# Patient Record
Sex: Female | Born: 1940 | Race: Black or African American | Hispanic: No | Marital: Married | Smoking: Never smoker
Health system: Southern US, Community
[De-identification: ages and names within clinical notes are randomized; demographics above are authoritative.]

## PROBLEM LIST (undated history)

## (undated) DIAGNOSIS — I421 Obstructive hypertrophic cardiomyopathy: Secondary | ICD-10-CM

## (undated) DIAGNOSIS — E785 Hyperlipidemia, unspecified: Secondary | ICD-10-CM

## (undated) DIAGNOSIS — I10 Essential (primary) hypertension: Secondary | ICD-10-CM

## (undated) HISTORY — DX: Obstructive hypertrophic cardiomyopathy: I42.1

## (undated) HISTORY — DX: Essential (primary) hypertension: I10

## (undated) HISTORY — DX: Hyperlipidemia, unspecified: E78.5

---

## 1983-12-06 HISTORY — PX: ABDOMINAL HYSTERECTOMY: SHX81

## 2014-01-02 ENCOUNTER — Ambulatory Visit (INDEPENDENT_AMBULATORY_CARE_PROVIDER_SITE_OTHER): Payer: Medicare Other | Admitting: Family Medicine

## 2014-01-02 ENCOUNTER — Encounter: Payer: Self-pay | Admitting: Family Medicine

## 2014-01-02 ENCOUNTER — Other Ambulatory Visit: Payer: Self-pay | Admitting: *Deleted

## 2014-01-02 ENCOUNTER — Other Ambulatory Visit: Payer: Self-pay

## 2014-01-02 VITALS — BP 188/102 | HR 77 | Ht 66.0 in | Wt 192.0 lb

## 2014-01-02 DIAGNOSIS — Z0000 Encounter for general adult medical examination without abnormal findings: Secondary | ICD-10-CM

## 2014-01-02 DIAGNOSIS — I10 Essential (primary) hypertension: Secondary | ICD-10-CM

## 2014-01-02 DIAGNOSIS — Z1231 Encounter for screening mammogram for malignant neoplasm of breast: Secondary | ICD-10-CM

## 2014-01-02 DIAGNOSIS — Z01419 Encounter for gynecological examination (general) (routine) without abnormal findings: Secondary | ICD-10-CM | POA: Insufficient documentation

## 2014-01-02 LAB — TSH: TSH: 5.503 u[IU]/mL — ABNORMAL HIGH (ref 0.350–4.500)

## 2014-01-02 LAB — CBC WITH DIFFERENTIAL/PLATELET
Basophils Absolute: 0 10*3/uL (ref 0.0–0.1)
Basophils Relative: 1 % (ref 0–1)
EOS PCT: 1 % (ref 0–5)
Eosinophils Absolute: 0.1 10*3/uL (ref 0.0–0.7)
HEMATOCRIT: 38.7 % (ref 36.0–46.0)
Hemoglobin: 13.1 g/dL (ref 12.0–15.0)
LYMPHS ABS: 1.1 10*3/uL (ref 0.7–4.0)
Lymphocytes Relative: 18 % (ref 12–46)
MCH: 28.7 pg (ref 26.0–34.0)
MCHC: 33.9 g/dL (ref 30.0–36.0)
MCV: 84.9 fL (ref 78.0–100.0)
MONO ABS: 0.6 10*3/uL (ref 0.1–1.0)
Monocytes Relative: 10 % (ref 3–12)
Neutro Abs: 4.5 10*3/uL (ref 1.7–7.7)
Neutrophils Relative %: 70 % (ref 43–77)
PLATELETS: 257 10*3/uL (ref 150–400)
RBC: 4.56 MIL/uL (ref 3.87–5.11)
RDW: 13.6 % (ref 11.5–15.5)
WBC: 6.3 10*3/uL (ref 4.0–10.5)

## 2014-01-02 LAB — COMPREHENSIVE METABOLIC PANEL
ALT: 12 U/L (ref 0–35)
AST: 15 U/L (ref 0–37)
Albumin: 3.8 g/dL (ref 3.5–5.2)
Alkaline Phosphatase: 97 U/L (ref 39–117)
BUN: 14 mg/dL (ref 6–23)
CALCIUM: 9.1 mg/dL (ref 8.4–10.5)
CHLORIDE: 105 meq/L (ref 96–112)
CO2: 27 meq/L (ref 19–32)
CREATININE: 0.76 mg/dL (ref 0.50–1.10)
Glucose, Bld: 146 mg/dL — ABNORMAL HIGH (ref 70–99)
POTASSIUM: 3.9 meq/L (ref 3.5–5.3)
Sodium: 140 mEq/L (ref 135–145)
Total Bilirubin: 0.6 mg/dL (ref 0.2–1.2)
Total Protein: 7.2 g/dL (ref 6.0–8.3)

## 2014-01-02 LAB — LIPID PANEL
CHOLESTEROL: 181 mg/dL (ref 0–200)
HDL: 45 mg/dL (ref >39)
LDL Cholesterol: 119 mg/dL — ABNORMAL HIGH (ref 0–99)
TRIGLYCERIDES: 83 mg/dL (ref <150)
Total CHOL/HDL Ratio: 4 Ratio
VLDL: 17 mg/dL (ref 0–40)

## 2014-01-02 LAB — POCT GLYCOSYLATED HEMOGLOBIN (HGB A1C): HEMOGLOBIN A1C: 7

## 2014-01-02 MED ORDER — HYDROCHLOROTHIAZIDE 25 MG PO TABS: 25.0000 mg | ORAL_TABLET | Freq: Every day | ORAL | Status: DC

## 2014-01-02 NOTE — Progress Notes (Signed)
Patient ID: Wendy Velez, female   DOB: May 15, 1941, 73 y.o.   MRN: [id number]   Kevin FentonSamuel Bradshaw, MD Phone: [phone]  Subjective:  Chief complaint-noted  Patient here to establish care. She states that greater than 10 years ago she had hypertension and was on medications for it at that time. She's had an EGD and a colonoscopy previously and was told that she had some changes of reflux at that time. She denies heartburn today.  Hypertension  - blood pressure is 188/109 initially and on recheck. She denies headache, chest pain, dyspnea, palpitations, and edema.  She notes some polyuria. Described as approximately 2 times of urination at night for several months. She denies dysuria and foul-smelling urine. Her son was recently diagnosed with diabetes.  She has a strong family history of Alzheimer's with her mother, and paternal uncle and aunt. She's a never smoker, and drinks about one glass of wine daily.  I have reviewed and updated her medical, surgical, family, and social history in the relevant portions of EMR.  ROS- Positive night sweats No dyspnea No chest pain No Abdominal pain   Past Medical History Patient Active Problem List   Diagnosis Date Noted  . Essential hypertension, benign 01/02/2014  . Well woman exam 01/02/2014    Medications- reviewed and updated Current Outpatient Prescriptions  Medication Sig Dispense Refill  . hydrochlorothiazide (HYDRODIURIL) 25 MG tablet Take 1 tablet (25 mg total) by mouth daily.  3 tablet  1   No current facility-administered medications for this visit.    Objective: BP 188/102  Pulse 77  Ht 5\' 6"  (1.676 m)  Wt 192 lb (87.091 kg)  BMI 31.00 kg/m2 Gen: NAD, alert, cooperative with exam HEENT: NCAT CV: RRR, good S1/S2, no murmur Resp: CTABL, no wheezes, non-labored Abd: SNTND, BS present, no guarding or organomegaly Ext: No edema, warm Neuro: Alert and oriented, No gross deficits   Assessment/Plan:  Essential  hypertension, benign Normally I prefer to have elevated readings at 2 different occasions, however considering her BP of 188/109 and history of hypertension I will go ahead and treat her. .Hydrochlorothiazide 25 daily Check fasting lipid, BMP, CBC, TSH, A1c Followup 3-4 weeks for blood pressure  Well woman exam Discussed need for colonoscopy and mammogram. Has had EGD and colonoscopy in the past with only changes of GERD at that time. Recommended annual pelvic exam, schedule it later date. Has had hysterectomy in the 80s, still has her ovaries.    Orders Placed This Encounter  Procedures  . Lipid Panel  . TSH  . CBC with Differential  . Comprehensive metabolic panel  . HgB A1c    Meds ordered this encounter  Medications  . hydrochlorothiazide (HYDRODIURIL) 25 MG tablet    Sig: Take 1 tablet (25 mg total) by mouth daily.    Dispense:  3 tablet    Refill:  1

## 2014-01-02 NOTE — Assessment & Plan Note (Addendum)
Normally I prefer to have elevated readings at 2 different occasions, however considering her BP of 188/109 and history of hypertension I will go ahead and treat her. .Hydrochlorothiazide 25 daily Check fasting lipid, BMP, CBC, TSH, A1c Followup 3-4 weeks for blood pressure

## 2014-01-02 NOTE — Patient Instructions (Addendum)
Great to meet you!  Lets bring you back in 1 month to check your blood pressure.   Call the GI doctors to get a colonoscopy  Be sure to get a mammogram

## 2014-01-02 NOTE — Assessment & Plan Note (Signed)
Discussed need for colonoscopy and mammogram. Has had EGD and colonoscopy in the past with only changes of GERD at that time. Recommended annual pelvic exam, schedule it later date. Has had hysterectomy in the 80s, still has her ovaries.

## 2014-01-06 ENCOUNTER — Telehealth: Payer: Self-pay | Admitting: Family Medicine

## 2014-01-06 NOTE — Telephone Encounter (Signed)
Called to follow up on labs  She has A1C 7.0 which means she ahs diabetes, at this point would be considered diet controlled but I'd like to offer metformin  Also TSH is high (5) so she may need synthroid but I think we should review symptoms and if she wants to be on this before I prescribe it.   Will ask red team to offer appt.   Left VM.   Murtis SinkSam Ronald Vinsant, MD [facility] Resident, PGY-2 01/06/2014, 12:34 PM

## 2014-01-06 NOTE — Telephone Encounter (Signed)
Called pt and informed. .Wendy Velez  

## 2014-01-15 ENCOUNTER — Ambulatory Visit
Admission: RE | Admit: 2014-01-15 | Discharge: 2014-01-15 | Disposition: A | Discharge status: Home / Self Care | Payer: Medicare Other | Source: Ambulatory Visit

## 2014-01-15 DIAGNOSIS — Z1231 Encounter for screening mammogram for malignant neoplasm of breast: Secondary | ICD-10-CM

## 2014-02-06 ENCOUNTER — Ambulatory Visit (INDEPENDENT_AMBULATORY_CARE_PROVIDER_SITE_OTHER): Payer: Medicare Other | Admitting: Family Medicine

## 2014-02-06 ENCOUNTER — Encounter: Payer: Self-pay | Admitting: Gastroenterology

## 2014-02-06 ENCOUNTER — Encounter: Payer: Self-pay | Admitting: Family Medicine

## 2014-02-06 VITALS — BP 178/92 | HR 80 | Ht 66.0 in | Wt 194.0 lb

## 2014-02-06 DIAGNOSIS — E119 Type 2 diabetes mellitus without complications: Secondary | ICD-10-CM | POA: Insufficient documentation

## 2014-02-06 DIAGNOSIS — F22 Delusional disorders: Secondary | ICD-10-CM | POA: Insufficient documentation

## 2014-02-06 DIAGNOSIS — I10 Essential (primary) hypertension: Secondary | ICD-10-CM

## 2014-02-06 DIAGNOSIS — E785 Hyperlipidemia, unspecified: Secondary | ICD-10-CM | POA: Insufficient documentation

## 2014-02-06 MED ORDER — PRAVASTATIN SODIUM 40 MG PO TABS: 40.0000 mg | ORAL_TABLET | Freq: Every day | ORAL | Status: DC

## 2014-02-06 MED ORDER — HYDROCHLOROTHIAZIDE 25 MG PO TABS: 25.0000 mg | ORAL_TABLET | Freq: Every day | ORAL | Status: DC

## 2014-02-06 MED ORDER — OLMESARTAN MEDOXOMIL 20 MG PO TABS: 20.0000 mg | ORAL_TABLET | Freq: Every day | ORAL | Status: DC

## 2014-02-06 NOTE — Assessment & Plan Note (Signed)
ASCVD score of 10.2, start moderate intensity statin-pravastatin 20 mg today. Recheck lipids in one year

## 2014-02-06 NOTE — Assessment & Plan Note (Signed)
Elevated today as high as 210/94, decreased to 178/92 on recheck No red flags  good med compliance, continue HCTZ add olmesartan 20 mg daily If she develops dyspnea will consider echo with soft crackles in right lower lung and likely chronic uncontrolled hypertension

## 2014-02-06 NOTE — Assessment & Plan Note (Signed)
Patient story about the people watching her seems unrealistic as far as I can see Does not appear to be any danger to herself or any other people, also not interfering with her life at all Will continue to monitor, no treatment needed at this time

## 2014-02-06 NOTE — Assessment & Plan Note (Signed)
Diet controlled with A1c of 7.0, new diagnosis Discussed lifestyle modification including cutting down on carbs and increasing exercise Offered metformin and last modifications, patient would like to modify lifestyle at this time  Followup A1c in 2 months

## 2014-02-06 NOTE — Patient Instructions (Signed)
It was great to see you today!  Your blood pressure is a big deal, I started another medicine for it, Olmesartan (benicar)  I also started a cholesterol medicine called pravastatin  Diet Recommendations for Diabetes   Starchy (carb) foods include: Bread, rice, pasta, potatoes, corn, crackers, bagels, muffins, all baked goods.   Protein foods include: Meat, fish, poultry, eggs, dairy foods, and beans such as pinto and kidney beans (beans also provide carbohydrate).   1. Eat at least 3 meals and 1-2 snacks per day. Never go more than 4-5 hours while awake without eating.  2. Limit starchy foods to TWO per meal and ONE per snack. ONE portion of a starchy  food is equal to the following:   - ONE slice of bread (or its equivalent, such as half of a hamburger bun).   - 1/2 cup of a "scoopable" starchy food such as potatoes or rice.   - 1 OUNCE (28 grams) of starchy snack foods such as crackers or pretzels (look on label).   - 15 grams of carbohydrate as shown on food label.  3. Both lunch and dinner should include a protein food, a carb food, and vegetables.   - Obtain twice as many veg's as protein or carbohydrate foods for both lunch and dinner.   - Try to keep frozen veg's on hand for a quick vegetable serving.     - Fresh or frozen veg's are best.  4. Breakfast should always include protein.     Hypertension As your heart beats, it forces blood through your arteries. This force is your blood pressure. If the pressure is too high, it is called hypertension (HTN) or high blood pressure. HTN is dangerous because you may have it and not know it. High blood pressure may mean that your heart has to work harder to pump blood. Your arteries may be narrow or stiff. The extra work puts you at risk for heart disease, stroke, and other problems.  Blood pressure consists of two numbers, a higher number over a lower, 110/72, for example. It is stated as "110 over 72." The ideal is below 120 for the top  number (systolic) and under 80 for the bottom (diastolic). Write down your blood pressure today. You should pay close attention to your blood pressure if you have certain conditions such as:  Heart failure.  Prior heart attack.  Diabetes  Chronic kidney disease.  Prior stroke.  Multiple risk factors for heart disease. To see if you have HTN, your blood pressure should be measured while you are seated with your arm held at the level of the heart. It should be measured at least twice. A one-time elevated blood pressure reading (especially in the Emergency Department) does not mean that you need treatment. There may be conditions in which the blood pressure is different between your right and left arms. It is important to see your caregiver soon for a recheck. Most people have essential hypertension which means that there is not a specific cause. This type of high blood pressure may be lowered by changing lifestyle factors such as:  Stress.  Smoking.  Lack of exercise.  Excessive weight.  Drug/tobacco/alcohol use.  Eating less salt. Most people do not have symptoms from high blood pressure until it has caused damage to the body. Effective treatment can often prevent, delay or reduce that damage. TREATMENT  When a cause has been identified, treatment for high blood pressure is directed at the cause. There are a  large number of medications to treat HTN. These fall into several categories, and your caregiver will help you select the medicines that are best for you. Medications may have side effects. You should review side effects with your caregiver. If your blood pressure stays high after you have made lifestyle changes or started on medicines,   Your medication(s) may need to be changed.  Other problems may need to be addressed.  Be certain you understand your prescriptions, and know how and when to take your medicine.  Be sure to follow up with your caregiver within the time frame  advised (usually within two weeks) to have your blood pressure rechecked and to review your medications.  If you are taking more than one medicine to lower your blood pressure, make sure you know how and at what times they should be taken. Taking two medicines at the same time can result in blood pressure that is too low. SEEK IMMEDIATE MEDICAL CARE IF:  You develop a severe headache, blurred or changing vision, or confusion.  You have unusual weakness or numbness, or a faint feeling.  You have severe chest or abdominal pain, vomiting, or breathing problems. MAKE SURE YOU:   Understand these instructions.  Will watch your condition.  Will get help right away if you are not doing well or get worse. Document Released: 11/21/2005 Document Revised: 02/13/2012 Document Reviewed: 07/11/2008 [facility] Patient Information 2014 [place], [place].

## 2014-02-06 NOTE — Progress Notes (Signed)
Patient ID: Wendy Velez, female   DOB: 1941/01/06, 73 y.o.   MRN: [id number]  Kevin FentonSamuel Bradshaw, MD Phone: [phone]  Subjective:  Chief complaint-noted  # followup of hypertension, diabetes  Hypertension Taking meds regularly, no headache, chest pain, dyspnea, palpitations, or lower extremity edema Does not check her blood pressure at home Wants to start walking and plans to tomorrow.  Diabetes Previously had not seen a Dr. in over 10 years when she established with me in January. She had an A1c of 7.0 that time. Today we discussed starting medications versus lifestyle modification and she would like to begin watching her diet and start exercising.  Delusions The patient describes to me that since 1998 people been watching her and "out to get her" using her phone and bank account to monitor her. She states that every time she passes her phone at home it flashes at her so she knows that they can see her. These do not seem to be interfering with her life, she can use the telephone she is very careful when she speaks of it., she states that her kids have also been watched, and they use telephones, she states that her daughter has gotten a divorce because of the people watching her.   ROS-  A fever, chills, sweats Per history of present illness  Past Medical History Patient Active Problem List   Diagnosis Date Noted  . DM2 (diabetes mellitus, type 2) 02/06/2014  . HLD (hyperlipidemia) 02/06/2014  . 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 Essential hypertension, benign 01/02/2014  . Well woman exam 01/02/2014    Medications- reviewed and updated Current Outpatient Prescriptions  Medication Sig Dispense Refill  . hydrochlorothiazide (HYDRODIURIL) 25 MG tablet Take 1 tablet (25 mg total) by mouth daily.  31 tablet  1  . Nutritional Supplements (ESTROVEN PO) Take by mouth. Has black cohosh      . olmesartan (BENICAR) 20 MG tablet Take 1 tablet (20 mg total) by mouth daily.  30 tablet   5  . pravastatin (PRAVACHOL) 40 MG tablet Take 1 tablet (40 mg total) by mouth daily.  90 tablet  3   No current facility-administered medications for this visit.    Objective: BP 178/92  Pulse 80  Ht 5\' 6"  (1.676 m)  Wt 194 lb (87.998 kg)  BMI 31.33 kg/m2 Gen: NAD, alert, cooperative with exam HEENT: NCAT CV: RRR, good S1/S2, no murmur Resp: Mostly clear bilaterally, some soft crackles on right lower lung field, good air movement, nonlabored Ext: No edema, warm Neuro: Alert and oriented, No gross deficits   Assessment/Plan:  DM2 (diabetes mellitus, type 2) Diet controlled with A1c of 7.0, new diagnosis Discussed lifestyle modification including cutting down on carbs and increasing exercise Offered metformin and last modifications, patient would like to modify lifestyle at this time  Followup A1c in 2 months  Essential hypertension, benign Elevated today as high as 210/94, decreased to 178/92 on recheck No red flags  good med compliance, continue HCTZ add olmesartan 20 mg daily If she develops dyspnea will consider echo with soft crackles in right lower lung and likely chronic uncontrolled hypertension  HLD (hyperlipidemia) ASCVD score of 10.2, start moderate intensity statin-pravastatin 20 mg today. Recheck lipids in one year   Paranoid delusion Patient story about the people watching her seems unrealistic as far as I can see Does not appear to be any danger to herself or any other people, also not interfering with her life at all Will continue to monitor, no  treatment needed at this time    No orders of the defined types were placed in this encounter.    Meds ordered this encounter  Medications  . olmesartan (BENICAR) 20 MG tablet    Sig: Take 1 tablet (20 mg total) by mouth daily.    Dispense:  30 tablet    Refill:  5  . hydrochlorothiazide (HYDRODIURIL) 25 MG tablet    Sig: Take 1 tablet (25 mg total) by mouth daily.    Dispense:  31 tablet    Refill:  1    . pravastatin (PRAVACHOL) 40 MG tablet    Sig: Take 1 tablet (40 mg total) by mouth daily.    Dispense:  90 tablet    Refill:  3

## 2014-02-26 ENCOUNTER — Ambulatory Visit (INDEPENDENT_AMBULATORY_CARE_PROVIDER_SITE_OTHER): Payer: Medicare Other | Admitting: Family Medicine

## 2014-02-26 ENCOUNTER — Encounter: Payer: Self-pay | Admitting: Family Medicine

## 2014-02-26 VITALS — BP 168/96 | HR 61 | Temp 97.5°F | Ht 66.0 in | Wt 192.0 lb

## 2014-02-26 DIAGNOSIS — E785 Hyperlipidemia, unspecified: Secondary | ICD-10-CM

## 2014-02-26 DIAGNOSIS — I10 Essential (primary) hypertension: Secondary | ICD-10-CM

## 2014-02-26 MED ORDER — HYDROCHLOROTHIAZIDE 25 MG PO TABS: 25.0000 mg | ORAL_TABLET | Freq: Every day | ORAL | Status: DC

## 2014-02-26 MED ORDER — AMLODIPINE BESYLATE 10 MG PO TABS: 10.0000 mg | ORAL_TABLET | Freq: Every day | ORAL | Status: DC

## 2014-02-26 NOTE — Assessment & Plan Note (Signed)
Elevated today to 168/96, this is an improvement from when she first presented here. Pulse 61 Complaining of headaches and feels that it's associated with olmesartan, this is not a typical side effect from a but may be secondary to decreasing her blood pressure. (Along with her intermittent dizziness) I discussed this with her I did agree to change her blood pressure medicine. Amlodipine, continue HCTZ, continue pravastatin If headaches do not improve would consider changing statin I expect that she bleeds 3 blood pressure medications for control but doesn't seem to be tolerating rapid changes, subtle plan on adding another agent next month for a followup for diabetes. Has made good lifestyle modifications

## 2014-02-26 NOTE — Assessment & Plan Note (Signed)
Good lifestyle modifications, taking statin Continue pravastatin

## 2014-02-26 NOTE — Patient Instructions (Signed)
It was great to see you today!  We're getting closer on your blood pressure, PLease come back in 1 month for a f/u of blood pressure and diabetes.   Diet Recommendations for Diabetes   Starchy (carb) foods include: Bread, rice, pasta, potatoes, corn, crackers, bagels, muffins, all baked goods.   Protein foods include: Meat, fish, poultry, eggs, dairy foods, and beans such as pinto and kidney beans (beans also provide carbohydrate).   1. Eat at least 3 meals and 1-2 snacks per day. Never go more than 4-5 hours while awake without eating.  2. Limit starchy foods to TWO per meal and ONE per snack. ONE portion of a starchy  food is equal to the following:   - ONE slice of bread (or its equivalent, such as half of a hamburger bun).   - 1/2 cup of a "scoopable" starchy food such as potatoes or rice.   - 1 OUNCE (28 grams) of starchy snack foods such as crackers or pretzels (look on label).   - 15 grams of carbohydrate as shown on food label.  3. Both lunch and dinner should include a protein food, a carb food, and vegetables.   - Obtain twice as many veg's as protein or carbohydrate foods for both lunch and dinner.   - Try to keep frozen veg's on hand for a quick vegetable serving.     - Fresh or frozen veg's are best.  4. Breakfast should always include protein.

## 2014-02-26 NOTE — Progress Notes (Addendum)
Patient ID: Wendy Velez, female   DOB: [birth year], 73 y.o.   MRN: [id number]  Kevin FentonSamuel Bradshaw, MD Phone: [phone]  Subjective:  Chief complaint-noted  # Followup for hypertension  Patient states that he's been having headaches since she started olmesartan. She describes them as once or twice daily frontal headaches lasting one to 2 hours. Moderate in severity. No associated nausea, vomiting, photosensitivity. She also has some slight dizziness for the last month, which she describes as intermittent, mild, and only every few days.  Has not been checking her blood pressure on home. She is going to [facility] to check it but can't figure out how to use the machine.  She's walking 5 times a week about 20-30 minutes now.  No chest pain, dyspnea, palpitations, or leg edema.   ROS- Per HPI  Past Medical History Patient Active Problem List   Diagnosis Date Noted  . DM2 (diabetes mellitus, type 2) 02/06/2014  . HLD (hyperlipidemia) 02/06/2014  . 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 Essential hypertension, benign 01/02/2014  . Well woman exam 01/02/2014    Medications- reviewed and updated Current Outpatient Prescriptions  Medication Sig Dispense Refill  . hydrochlorothiazide (HYDRODIURIL) 25 MG tablet Take 1 tablet (25 mg total) by mouth daily.  31 tablet  11  . Nutritional Supplements (ESTROVEN PO) Take by mouth. Has black cohosh      . pravastatin (PRAVACHOL) 40 MG tablet Take 1 tablet (40 mg total) by mouth daily.  90 tablet  3  . amLODipine (NORVASC) 10 MG tablet Take 1 tablet (10 mg total) by mouth daily.  30 tablet  5   No current facility-administered medications for this visit.    Objective: BP 168/96  Pulse 61  Temp(Src) 97.5 F (36.4 C) (Oral)  Ht 5\' 6"  (1.676 m)  Wt 192 lb (87.091 kg)  BMI 31.00 kg/m2 Gen: NAD, alert, cooperative with exam HEENT: NCAT CV: RRR, good S1/S2, no murmur Resp: CTABL, no wheezes, non-labored Ext: No edema, warm Neuro: Alert and  oriented, No gross deficits   Assessment/Plan:  Essential hypertension, benign Elevated today to 168/96, this is an improvement from when she first presented here. Pulse 61 Complaining of headaches and feels that it's associated with olmesartan, this is not a typical side effect from a but may be secondary to decreasing her blood pressure. (Along with her intermittent dizziness) I discussed this with her I did agree to change her blood pressure medicine. Amlodipine, continue HCTZ, continue pravastatin If headaches do not improve would consider changing statin I expect that she bleeds 3 blood pressure medications for control but doesn't seem to be tolerating rapid changes, subtle plan on adding another agent next month for a followup for diabetes. Has made good lifestyle modifications  HLD (hyperlipidemia) Good lifestyle modifications, taking statin Continue pravastatin    No orders of the defined types were placed in this encounter.    Meds ordered this encounter  Medications  . amLODipine (NORVASC) 10 MG tablet    Sig: Take 1 tablet (10 mg total) by mouth daily.    Dispense:  30 tablet    Refill:  5  . hydrochlorothiazide (HYDRODIURIL) 25 MG tablet    Sig: Take 1 tablet (25 mg total) by mouth daily.    Dispense:  31 tablet    Refill:  11

## 2014-03-13 ENCOUNTER — Ambulatory Visit (AMBULATORY_SURGERY_CENTER): Payer: Self-pay | Admitting: *Deleted

## 2014-03-13 VITALS — Ht 67.5 in | Wt 193.2 lb

## 2014-03-13 DIAGNOSIS — Z1211 Encounter for screening for malignant neoplasm of colon: Secondary | ICD-10-CM

## 2014-03-13 MED ORDER — MOVIPREP 100 G PO SOLR
ORAL | Status: DC
Start: 2014-03-13 — End: 2014-03-25

## 2014-03-13 NOTE — Progress Notes (Signed)
No allergies to eggs or soy. No problems with anesthesia.  

## 2014-03-25 ENCOUNTER — Encounter: Payer: Self-pay | Admitting: Gastroenterology

## 2014-03-25 ENCOUNTER — Ambulatory Visit (AMBULATORY_SURGERY_CENTER): Payer: Medicare Other | Admitting: Gastroenterology

## 2014-03-25 VITALS — BP 136/93 | HR 97 | Temp 98.0°F | Resp 21 | Ht 67.0 in | Wt 193.0 lb

## 2014-03-25 DIAGNOSIS — K5730 Diverticulosis of large intestine without perforation or abscess without bleeding: Secondary | ICD-10-CM

## 2014-03-25 DIAGNOSIS — K644 Residual hemorrhoidal skin tags: Secondary | ICD-10-CM

## 2014-03-25 DIAGNOSIS — Z1211 Encounter for screening for malignant neoplasm of colon: Secondary | ICD-10-CM

## 2014-03-25 MED ORDER — SODIUM CHLORIDE 0.9 % IV SOLN: 500.0000 mL | INTRAVENOUS | Status: DC

## 2014-03-25 NOTE — Op Note (Signed)
[facility] [address] [place], [id number]   COLONOSCOPY PROCEDURE REPORT  PATIENT: [name], Wendy J.  MR#: [id number] BIRTHDATE: [birth year] , 72  yrs. old GENDER: Female ENDOSCOPIST: Rachael Feeaniel P Breahna Boylen, MD REFERRED BY: Kevin FentonSamuel Bradshaw, MD PROCEDURE DATE:  03/25/2014 PROCEDURE:   Colonoscopy, screening First Screening Colonoscopy - Avg.  risk and is 50 yrs.  old or older - No.  Prior Negative Screening - Now for repeat screening. 10 or more years since last screening  History of Adenoma - Now for follow-up colonoscopy & has been > or = to 3 yrs.  N/A  Polyps Removed Today? No.  Recommend repeat exam, <10 yrs? No. ASA CLASS:   Class II INDICATIONS:average risk screening. MEDICATIONS: MAC sedation, administered by CRNA and propofol (Diprivan) 200mg  IV  DESCRIPTION OF PROCEDURE:   After the risks benefits and alternatives of the procedure were thoroughly explained, informed consent was obtained.  A digital rectal exam revealed no abnormalities of the rectum.   The LB [id number]  endoscope was introduced through the anus and advanced to the cecum, which was identified by both the appendix and ileocecal valve. No adverse events experienced.   The quality of the prep was excellent.  The instrument was then slowly withdrawn as the colon was fully examined.  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  There were small to medium external hemorrhoids.  The examination was otherwise normal.  Retroflexed views revealed no abnormalities. The time to cecum=3 minutes 33 seconds.  Withdrawal time=7 minutes 49 seconds.  The scope was withdrawn and the procedure completed. COMPLICATIONS: There were no complications.  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 There were small to medium external hemorrhoids. The examination was otherwise normal.  No polyps or cancers  RECOMMENDATIONS: Given your age, you will not need another  colonoscopy for colon cancer screening or polyp surveillance.  These types of tests usually stop around the age 75.   eSigned:  Rachael Feeaniel P Marigene Erler, MD 03/25/2014 11:06 AM

## 2014-03-25 NOTE — Progress Notes (Signed)
Report to pacu rn, vss, bbs=clear 

## 2014-03-25 NOTE — Patient Instructions (Signed)

## 2014-03-26 ENCOUNTER — Telehealth: Payer: Self-pay | Admitting: *Deleted

## 2014-03-26 NOTE — Telephone Encounter (Signed)
  Follow up Call-  Call back number 03/25/2014  Post procedure Call Back phone  # [phone]  Permission to leave phone message Yes     Patient questions:  Do you have a fever, pain , or abdominal swelling? no Pain Score  0 *  Have you tolerated food without any problems? yes  Have you been able to return to your normal activities? yes  Do you have any questions about your discharge instructions: Diet   no Medications  no Follow up visit  no  Do you have questions or concerns about your Care? no  Actions: * If pain score is 4 or above: No action needed, pain <4.

## 2014-04-02 ENCOUNTER — Encounter: Payer: Self-pay | Admitting: Family Medicine

## 2014-04-02 ENCOUNTER — Ambulatory Visit (HOSPITAL_COMMUNITY)
Admission: RE | Admit: 2014-04-02 | Discharge: 2014-04-02 | Disposition: A | Discharge status: Home / Self Care | Payer: Medicare Other | Source: Ambulatory Visit | Attending: Family Medicine | Admitting: Family Medicine

## 2014-04-02 ENCOUNTER — Ambulatory Visit (INDEPENDENT_AMBULATORY_CARE_PROVIDER_SITE_OTHER): Payer: Medicare Other | Admitting: Family Medicine

## 2014-04-02 VITALS — BP 145/94 | HR 80 | Temp 98.0°F | Wt 191.0 lb

## 2014-04-02 DIAGNOSIS — I10 Essential (primary) hypertension: Secondary | ICD-10-CM

## 2014-04-02 DIAGNOSIS — R9431 Abnormal electrocardiogram [ECG] [EKG]: Secondary | ICD-10-CM | POA: Insufficient documentation

## 2014-04-02 DIAGNOSIS — N644 Mastodynia: Secondary | ICD-10-CM

## 2014-04-02 LAB — CBC WITH DIFFERENTIAL/PLATELET
BASOS ABS: 0 10*3/uL (ref 0.0–0.1)
BASOS PCT: 0 % (ref 0–1)
EOS PCT: 4 % (ref 0–5)
Eosinophils Absolute: 0.3 10*3/uL (ref 0.0–0.7)
HCT: 36.1 % (ref 36.0–46.0)
HEMOGLOBIN: 12.2 g/dL (ref 12.0–15.0)
LYMPHS PCT: 20 % (ref 12–46)
Lymphs Abs: 1.4 10*3/uL (ref 0.7–4.0)
MCH: 28.7 pg (ref 26.0–34.0)
MCHC: 33.8 g/dL (ref 30.0–36.0)
MCV: 84.9 fL (ref 78.0–100.0)
MONO ABS: 0.6 10*3/uL (ref 0.1–1.0)
Monocytes Relative: 9 % (ref 3–12)
Neutro Abs: 4.6 10*3/uL (ref 1.7–7.7)
Neutrophils Relative %: 67 % (ref 43–77)
Platelets: 264 10*3/uL (ref 150–400)
RBC: 4.25 MIL/uL (ref 3.87–5.11)
RDW: 13.4 % (ref 11.5–15.5)
WBC: 6.8 10*3/uL (ref 4.0–10.5)

## 2014-04-02 NOTE — Patient Instructions (Signed)
At to see you today!  You will be called in the next week or so for your appointments.   Your pain could be from your heart, please dont hesitate to seek medical help if it worsens  Chest Pain (Nonspecific) It is often hard to give a specific diagnosis for the cause of chest pain. There is always a chance that your pain could be related to something serious, such as a heart attack or a blood clot in the lungs. You need to follow up with your caregiver for further evaluation. CAUSES   Heartburn.  Pneumonia or bronchitis.  Anxiety or stress.  Inflammation around your heart (pericarditis) or lung (pleuritis or pleurisy).  A blood clot in the lung.  A collapsed lung (pneumothorax). It can develop suddenly on its own (spontaneous pneumothorax) or from injury (trauma) to the chest.  Shingles infection (herpes zoster virus). The chest wall is composed of bones, muscles, and cartilage. Any of these can be the source of the pain.  The bones can be bruised by injury.  The muscles or cartilage can be strained by coughing or overwork.  The cartilage can be affected by inflammation and become sore (costochondritis). DIAGNOSIS  Lab tests or other studies, such as X-rays, electrocardiography, stress testing, or cardiac imaging, may be needed to find the cause of your pain.  TREATMENT   Treatment depends on what may be causing your chest pain. Treatment may include:  Acid blockers for heartburn.  Anti-inflammatory medicine.  Pain medicine for inflammatory conditions.  Antibiotics if an infection is present.  You may be advised to change lifestyle habits. This includes stopping smoking and avoiding alcohol, caffeine, and chocolate.  You may be advised to keep your head raised (elevated) when sleeping. This reduces the chance of acid going backward from your stomach into your esophagus.  Most of the time, nonspecific chest pain will improve within 2 to 3 days with rest and mild pain  medicine. HOME CARE INSTRUCTIONS   If antibiotics were prescribed, take your antibiotics as directed. Finish them even if you start to feel better.  For the next few days, avoid physical activities that bring on chest pain. Continue physical activities as directed.  Do not smoke.  Avoid drinking alcohol.  Only take over-the-counter or prescription medicine for pain, discomfort, or fever as directed by your caregiver.  Follow your caregiver's suggestions for further testing if your chest pain does not go away.  Keep any follow-up appointments you made. If you do not go to an appointment, you could develop lasting (chronic) problems with pain. If there is any problem keeping an appointment, you must call to reschedule. SEEK MEDICAL CARE IF:   You think you are having problems from the medicine you are taking. Read your medicine instructions carefully.  Your chest pain does not go away, even after treatment.  You develop a rash with blisters on your chest. SEEK IMMEDIATE MEDICAL CARE IF:   You have increased chest pain or pain that spreads to your arm, neck, jaw, back, or abdomen.  You develop shortness of breath, an increasing cough, or you are coughing up blood.  You have severe back or abdominal pain, feel nauseous, or vomit.  You develop severe weakness, fainting, or chills.  You have a fever. THIS IS AN EMERGENCY. Do not wait to see if the pain will go away. Get medical help at once. Call your local emergency services (911 in [place] Do not drive yourself to the hospital. MAKE SURE YOU:  Understand these instructions.  Will watch your condition.  Will get help right away if you are not doing well or get worse. Document Released: 08/31/2005 Document Revised: 02/13/2012 Document Reviewed: 06/26/2008 [facility] Patient Information 2014 [place].

## 2014-04-02 NOTE — Assessment & Plan Note (Signed)
Patient very concerned despite negative mammo, could ceartainly be referred pain from CAD, not exertional though Exam unrevealing, will send to GYN for 2nd opinion  Have not discussed with her that I think she has paranoid delusions, hopefully this is not the beginning of a delusion about a medical problem Considering cough, dysphagia, rsik factors, and radiation to R arm EKG performed with arrythmia but no specific findings, will ask cards to eval as well.

## 2014-04-02 NOTE — Assessment & Plan Note (Signed)
With breast pain, Q wave in lead 3 and abnormal complex in lead 2 With her risk factors  Of HTN, DM2 I think a cards eval is reasonable Checked CBC and troponin, pain for several weeks

## 2014-04-02 NOTE — Progress Notes (Signed)
Patient ID: Wendy Velez, female   DOB: 17-Feb-1941, 73 y.o.   MRN: [id number]  Kevin FentonSamuel Bradshaw, MD Phone: [phone]  Subjective:  Chief complaint-noted  # R breast pain, HTN, cough  Pt with previous paranoid delusions, DM2 and HTN here with R breast discomfort for several weeks, not convinced by her previous negative mammo. State she has an indention in her R breast and doesn't feel this is normal.  Denies dc Discomfort radiates to R arm No exertional component, no palpitations, no dyspnea Also having 2 weeks of cough and mild intermittent dysphagia that she feels is connected.   HTN No overt CP but bnreast pain as above, No palpitations, and no edema Taking meds regularly  ROS- Per HPI  Past Medical History Patient Active Problem List   Diagnosis Date Noted  . Breast pain in female 04/02/2014  . Nonspecific abnormal electrocardiogram (ECG) (EKG) 04/02/2014  . DM2 (diabetes mellitus, type 2) 02/06/2014  . HLD (hyperlipidemia) 02/06/2014  . 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 Essential hypertension, benign 01/02/2014  . Well woman exam 01/02/2014    Medications- reviewed and updated Current Outpatient Prescriptions  Medication Sig Dispense Refill  . amLODipine (NORVASC) 10 MG tablet Take 1 tablet (10 mg total) by mouth daily.  30 tablet  5  . hydrochlorothiazide (HYDRODIURIL) 25 MG tablet Take 1 tablet (25 mg total) by mouth daily.  31 tablet  11  . Nutritional Supplements (ESTROVEN PO) Take by mouth. Has black cohosh      . pravastatin (PRAVACHOL) 40 MG tablet Take 1 tablet (40 mg total) by mouth daily.  90 tablet  3   No current facility-administered medications for this visit.    Objective: BP 145/94  Pulse 80  Temp(Src) 98 F (36.7 C) (Oral)  Wt 191 lb (86.637 kg) Gen: NAD, alert, cooperative with exam HEENT: NCAT CV: irregular irregular, no murmur, normal rate Breast: no fixed masses or rashes, no dc, nippples WNL Resp: CTABL, no wheezes,  non-labored Ext: No edema, warm Neuro: Alert and oriented, No gross deficits   Assessment/Plan:  Nonspecific abnormal electrocardiogram (ECG) (EKG) With breast pain, Q wave in lead 3 and abnormal complex in lead 2 With her risk factors  Of HTN, DM2 I think a cards eval is reasonable Checked CBC and troponin, pain for several weeks   Essential hypertension, benign Controlled better today, good compliance ARB caused HA Continue amlodipine and HCTZ, if CAD reveals itself a BB will be needed, plan coreg  Breast pain in female Patient very concerned despite negative mammo, could ceartainly be referred pain from CAD, not exertional though Exam unrevealing, will send to GYN for 2nd opinion  Have not discussed with her that I think she has paranoid delusions, hopefully this is not the beginning of a delusion about a medical problem Considering cough, dysphagia, rsik factors, and radiation to R arm EKG performed with arrythmia but no specific findings, will ask cards to eval as well.     Orders Placed This Encounter  Procedures  . Troponin I  . CBC with Differential  . Ambulatory referral to Obstetrics / Gynecology    Referral Priority:  Routine    Referral Type:  Consultation    Referral Reason:  Specialty Services Required    Requested Specialty:  Obstetrics and Gynecology    Number of Visits Requested:  1  . Ambulatory referral to Cardiology    Referral Priority:  Routine    Referral Type:  Consultation    Referral Reason:  Specialty Services Required    Requested Specialty:  Cardiology    Number of Visits Requested:  1  . EKG 12-Lead

## 2014-04-02 NOTE — Assessment & Plan Note (Signed)
Controlled better today, good compliance ARB caused HA Continue amlodipine and HCTZ, if CAD reveals itself a BB will be needed, plan coreg

## 2014-04-03 LAB — TROPONIN I: Troponin I: 0.03 ng/mL (ref <0.06)

## 2014-04-04 ENCOUNTER — Telehealth: Payer: Self-pay | Admitting: Family Medicine

## 2014-04-04 NOTE — Telephone Encounter (Signed)
Pt left a note at the front desk asking that her OB/GYN appt be made b/c she is very worried about a breast mass. She has ahd a negative mammo and my breast exam was benign. I am sending her for reassurance, adn of course Tx if needed.   I called and left a VM that the referral is made and it is waiting in line for a nurse to call and set up the appt, I gave the expectation that it may take up to a week.   I welcomed her to call with questions.   Murtis SinkSam Deion Forgue, MD [facility] Resident, PGY-2 04/04/2014, 4:35 PM

## 2014-04-08 ENCOUNTER — Encounter: Payer: Self-pay | Admitting: Obstetrics and Gynecology

## 2014-04-30 ENCOUNTER — Ambulatory Visit (INDEPENDENT_AMBULATORY_CARE_PROVIDER_SITE_OTHER): Payer: Medicare Other | Admitting: Cardiology

## 2014-04-30 ENCOUNTER — Encounter: Payer: Self-pay | Admitting: Cardiology

## 2014-04-30 VITALS — BP 144/76 | HR 98 | Ht 67.0 in | Wt 192.0 lb

## 2014-04-30 DIAGNOSIS — R9431 Abnormal electrocardiogram [ECG] [EKG]: Secondary | ICD-10-CM

## 2014-04-30 DIAGNOSIS — I10 Essential (primary) hypertension: Secondary | ICD-10-CM

## 2014-04-30 DIAGNOSIS — I4719 Other supraventricular tachycardia: Secondary | ICD-10-CM | POA: Insufficient documentation

## 2014-04-30 DIAGNOSIS — I499 Cardiac arrhythmia, unspecified: Secondary | ICD-10-CM

## 2014-04-30 DIAGNOSIS — I471 Supraventricular tachycardia: Secondary | ICD-10-CM | POA: Insufficient documentation

## 2014-04-30 DIAGNOSIS — R0789 Other chest pain: Secondary | ICD-10-CM

## 2014-04-30 NOTE — Progress Notes (Signed)
  [address] [place], [place]  [address] Phone: [phone] Fax:  [phone]  Date:  04/30/2014   ID:  Wendy Velez, Wendy Velez [birth year], MRN [id number]  PCP:  Kevin Fenton, MD  Cardiologist:  Armanda Magic, MD     History of Present Illness: Wendy Velez is a 73 y.o. female with a history of dyslipidemia and HTN who presents today for evaluation.  She recently saw her PCP for chest pain that she says is from her breasts and an EKG was done which showed Q waves in II and AVF.  She has never had any heart problems in the past.  She says that she gets discomfort each morning when she takes her pills.  She says that her mouth is thick.  She feels like the pills are getting stuck but she does not have any problems swallowing food.  She denies any exertional chest pressure.  She denies any SOB or DOE, LE edema, dizziness, palpitations or syncope.   Wt Readings from Last 3 Encounters:  04/30/14 192 lb (87.091 kg)  04/02/14 191 lb (86.637 kg)  03/25/14 193 lb (87.544 kg)     Past Medical History  Diagnosis Date  . Hypertension   . Hyperlipidemia     Current Outpatient Prescriptions  Medication Sig Dispense Refill  . amLODipine (NORVASC) 10 MG tablet Take 1 tablet (10 mg total) by mouth daily.  30 tablet  5  . hydrochlorothiazide (HYDRODIURIL) 25 MG tablet Take 1 tablet (25 mg total) by mouth daily.  31 tablet  11  . Nutritional Supplements (ESTROVEN PO) Take by mouth. Has black cohosh      . pravastatin (PRAVACHOL) 40 MG tablet Take 1 tablet (40 mg total) by mouth daily.  90 tablet  3   No current facility-administered medications for this visit.    Allergies:   No Known Allergies  Social History:  The patient  reports that she has never smoked. She has never used smokeless tobacco. She reports that she drinks about 4.2 ounces of alcohol per week. She reports that she does not use illicit drugs.   Family History:  The patient's family history includes  Alzheimer's disease in her mother, paternal aunt, and paternal uncle; Diabetes in her son. There is no history of Colon cancer.   ROS:  Please see the history of present illness.      All other systems reviewed and negative.   PHYSICAL EXAM: VS:  BP 144/76  Pulse 98  Ht 5\' 7"  (1.702 m)  Wt 192 lb (87.091 kg)  BMI 30.06 kg/m2 Well nourished, well developed, in no acute distress HEENT: normal Neck: no JVD Cardiac:  normal S1, S2; RRR; no murmur with frequent ectopy Lungs:  clear to auscultation bilaterally, no wheezing, rhonchi or rales Abd: soft, nontender, no hepatomegaly Ext: no edema Skin: warm and dry Neuro:  CNs 2-12 intact, no focal abnormalities noted  EKG:  NSR with inferior infarct     ASSESSMENT AND PLAN:  1. Abnormal EKG with inferior infarct pattern - stress myoview since she has also been having CP 2. Atypical CP that seems to correlate with when she takes her meds 3. Arrhythmia - her heart is skipping some on exam today and sounds like PVC's - event monitor to assess  Followup with me PRN pending results of studies  Signed, Armanda Magic, MD 04/30/2014 4:14 PM

## 2014-04-30 NOTE — Patient Instructions (Addendum)
Your physician has requested that you have en exercise stress myoview. For further information please visit [email or web address] Please follow instruction sheet, as given.  Your physician has recommended that you wear a holter monitor. Holter monitors are medical devices that record the heart's electrical activity. Doctors most often use these monitors to diagnose arrhythmias. Arrhythmias are problems with the speed or rhythm of the heartbeat. The monitor is a small, portable device. You can wear one while you do your normal daily activities. This is usually used to diagnose what is causing palpitations/syncope (passing out).  Your physician recommends that you schedule a follow-up appointment AS NEEDED WITH DR TURNER.

## 2014-05-05 ENCOUNTER — Encounter (INDEPENDENT_AMBULATORY_CARE_PROVIDER_SITE_OTHER): Payer: Medicare Other

## 2014-05-05 ENCOUNTER — Encounter: Payer: Self-pay | Admitting: *Deleted

## 2014-05-05 DIAGNOSIS — R0789 Other chest pain: Secondary | ICD-10-CM

## 2014-05-05 DIAGNOSIS — I499 Cardiac arrhythmia, unspecified: Secondary | ICD-10-CM

## 2014-05-05 DIAGNOSIS — I10 Essential (primary) hypertension: Secondary | ICD-10-CM

## 2014-05-05 DIAGNOSIS — R9431 Abnormal electrocardiogram [ECG] [EKG]: Secondary | ICD-10-CM

## 2014-05-05 NOTE — Progress Notes (Signed)
Patient ID: Wendy Velez, female   DOB: [birth year], 73 y.o.   MRN: [id number] E-Cardio 48 hour holter monitor applied to patient.

## 2014-05-13 ENCOUNTER — Ambulatory Visit (HOSPITAL_COMMUNITY): Payer: Medicare Other | Attending: Cardiovascular Disease | Admitting: Radiology

## 2014-05-13 VITALS — BP 129/78 | Ht 67.0 in | Wt 190.0 lb

## 2014-05-13 DIAGNOSIS — R0789 Other chest pain: Secondary | ICD-10-CM

## 2014-05-13 DIAGNOSIS — R002 Palpitations: Secondary | ICD-10-CM | POA: Insufficient documentation

## 2014-05-13 DIAGNOSIS — I499 Cardiac arrhythmia, unspecified: Secondary | ICD-10-CM

## 2014-05-13 DIAGNOSIS — I4949 Other premature depolarization: Secondary | ICD-10-CM

## 2014-05-13 DIAGNOSIS — I10 Essential (primary) hypertension: Secondary | ICD-10-CM | POA: Insufficient documentation

## 2014-05-13 DIAGNOSIS — R9431 Abnormal electrocardiogram [ECG] [EKG]: Secondary | ICD-10-CM | POA: Insufficient documentation

## 2014-05-13 DIAGNOSIS — R079 Chest pain, unspecified: Secondary | ICD-10-CM | POA: Insufficient documentation

## 2014-05-13 MED ORDER — TECHNETIUM TC 99M SESTAMIBI GENERIC - CARDIOLITE
10.8000 | Freq: Once | INTRAVENOUS | Status: AC | PRN
  Administered 2014-05-13: 11 via INTRAVENOUS

## 2014-05-13 MED ORDER — TECHNETIUM TC 99M SESTAMIBI GENERIC - CARDIOLITE
33.0000 | Freq: Once | INTRAVENOUS | Status: AC | PRN
  Administered 2014-05-13: 33 via INTRAVENOUS

## 2014-05-13 NOTE — Progress Notes (Signed)
[facility] SITE 3 NUCLEAR MED [address] [place], [place] [address] [phone]    Cardiology Nuclear Med Study  Wendy Velez is a 73 y.o. female     MRN : [id number]     DOB: [birth year]  Procedure Date: 05/13/2014  Nuclear Med Background Indication for Stress Test:  Evaluation for Ischemia and Abnormal EKG History:  No H/O CAD Cardiac Risk Factors: Hypertension and Lipids  Symptoms:  Chest Pain and Palpitations   Nuclear Pre-Procedure Caffeine/Decaff Intake:  None 12 hrs NPO After: 7:30pm   Lungs:  clear O2 Sat: 98% on room air. IV 0.9% NS with Angio Cath:  22g  IV Site: R Wrist x 1, tolerated well IV Started by:  Irean Hong, RN  Chest Size (in):  40 Cup Size: C  Height: 5\' 7"  (1.702 m)  Weight:  190 lb (86.183 kg)  BMI:  Body mass index is 29.75 kg/(m^2). Tech Comments:  Patient took Norvasc this am. Irean Hong, RN.    Nuclear Med Study 1 or 2 day study: 1 day  Stress Test Type:  Stress  Reading MD: N/A  Order Authorizing Provider:  Armanda Magic, MD  Resting Radionuclide: Technetium 82m Sestamibi  Resting Radionuclide Dose: 11.0 mCi   Stress Radionuclide:  Technetium 65m Sestamibi  Stress Radionuclide Dose: 33.0 mCi           Stress Protocol Rest HR: 59 Stress HR: 162  Rest BP: 129/78 Stress BP: 151/109  Exercise Time (min): 3:00 METS: 4.6   Predicted Max HR: 146 bpm % Max HR: 110.96 bpm Rate Pressure Product: 21117   Dose of Adenosine (mg):  n/a Dose of Lexiscan: n/a mg  Dose of Atropine (mg): n/a Dose of Dobutamine: n/a mcg/kg/min (at max HR)  Stress Test Technologist: Milana Na, EMT-P  Nuclear Technologist:  Harlow Asa, CNMT     Rest Procedure:  Myocardial perfusion imaging was performed at rest 45 minutes following the intravenous administration of Technetium 28m Sestamibi. Rest ECG: NSR with non-specific ST-T wave changes  Stress Procedure:  The patient exercised on the treadmill utilizing the Bruce Protocol for 3:00  minutes. The patient stopped due to fatifue, sob, and denied any chest pain.  Technetium 48m Sestamibi was injected at peak exercise and myocardial perfusion imaging was performed after a brief delay. Stress ECG: No significant change from baseline ECG  QPS Raw Data Images:  Normal; no motion artifact; normal heart/lung ratio. Stress Images:  Normal homogeneous uptake in all areas of the myocardium. Rest Images:  Normal homogeneous uptake in all areas of the myocardium. Subtraction (SDS):  No evidence of ischemia. Transient Ischemic Dilatation (Normal <1.22):  1.01 Lung/Heart Ratio (Normal <0.45):  0.29  Quantitative Gated Spect Images QGS EDV:  92 ml QGS ESV:  37 ml  Impression Exercise Capacity:  Poor exercise capacity. BP Response:  Normal blood pressure response. Clinical Symptoms:  No chest pain. ECG Impression:  No significant ST segment change suggestive of ischemia. Comparison with Prior Nuclear Study: No images to compare  Overall Impression:  Low risk stress nuclear study.   Poor exercise tolerance. No evidence of ischemia.  LV Ejection Fraction: 60%.  LV Wall Motion:  NL LV Function; NL Wall Motion  Cassell Clement MD

## 2014-05-16 ENCOUNTER — Telehealth: Payer: Self-pay | Admitting: Cardiology

## 2014-05-16 MED ORDER — METOPROLOL SUCCINATE ER 25 MG PO TB24: 25.0000 mg | ORAL_TABLET | Freq: Every day | ORAL | Status: DC

## 2014-05-16 NOTE — Telephone Encounter (Signed)
Pt notified. Rx for metoprolol sent in and appt made with Tereso NewcomerScott Weaver, PA.

## 2014-05-16 NOTE — Addendum Note (Signed)
Addended byOrlene Plum: Keviana Guida H on: 05/16/2014 04:10 PM   Modules accepted: Orders

## 2014-05-16 NOTE — Telephone Encounter (Signed)
Please let patient know that heart monitor showed NSR with extra heart beats from the top and the bottom of her heart (SVT, nonsustained atrial tach and PVC's).  Please add Toprol XL 25mg  daily and have her followup with Tereso NewcomerScott Weaver PA-C in 3-4 weeks to make sure she is tolerating itpra

## 2014-05-16 NOTE — Telephone Encounter (Signed)
lmtrc

## 2014-05-21 ENCOUNTER — Ambulatory Visit (INDEPENDENT_AMBULATORY_CARE_PROVIDER_SITE_OTHER): Payer: Medicare Other | Admitting: Obstetrics and Gynecology

## 2014-05-21 ENCOUNTER — Other Ambulatory Visit: Payer: Self-pay | Admitting: Obstetrics and Gynecology

## 2014-05-21 ENCOUNTER — Encounter: Payer: Self-pay | Admitting: Obstetrics and Gynecology

## 2014-05-21 ENCOUNTER — Telehealth: Payer: Self-pay | Admitting: Gastroenterology

## 2014-05-21 VITALS — BP 141/93 | HR 91 | Temp 98.2°F | Ht 68.0 in | Wt 188.0 lb

## 2014-05-21 DIAGNOSIS — R1310 Dysphagia, unspecified: Secondary | ICD-10-CM

## 2014-05-21 NOTE — Telephone Encounter (Signed)
She will need office appt, next available with extender or myself..  If this is just a difficulty with pills, then she should continue taking the pills with crackers in the meantime.

## 2014-05-21 NOTE — Progress Notes (Signed)
Patient ID: Wendy Velez, female   DOB: 1941-10-18, 73 y.o.   MRN: [id number] 73 yo G2P2 [facility] of a one-month history of right breast pain and difficulty swallowing her pills. Patient reports the breast pain is present upon palpation and describes it as soreness. She also has difficulty abducting her right arm secondary to axillary pain. Patient also reports onset of difficulty with swallowing around the same time. She denies nausea or emesis.  GENERAL: Well-developed, well-nourished female in no acute distress.  BREASTS: Symmetric in size. No palpable masses skin changes, or nipple drainage. Tenderness over mediastinum region. Palpable lymphadenopathy in right axilla EXTREMITIES: No cyanosis, clubbing, or edema, 2+ distal pulses.  A/P 73 yo with right breast pain and dysphagia - Will refer to breast center for breast ultrasound  - Will refer to GI for upper endoscopy - Follow up with PCP

## 2014-05-21 NOTE — Telephone Encounter (Signed)
Pt states that she is having problems swallowing pills, states that she has to eat crackers and then the pills will go down. Please advise.

## 2014-05-21 NOTE — Progress Notes (Signed)
Patient is an established patient at [facility]. The receptionist stated that her doctors schedule is totally full. They are going to have Dr. Christella HartiganJacobs nurse call patient to evaluate problem and get her worked in appropriately. Patient is aware.

## 2014-05-22 NOTE — Telephone Encounter (Signed)
The pt has decided to call back to schedule she does not want to have any appt right now.  She will take her pills with crackers.

## 2014-05-22 NOTE — Telephone Encounter (Signed)
Left message on machine to call back  

## 2014-05-23 ENCOUNTER — Other Ambulatory Visit: Payer: Self-pay

## 2014-05-23 ENCOUNTER — Other Ambulatory Visit: Payer: Self-pay | Admitting: Obstetrics and Gynecology

## 2014-05-23 DIAGNOSIS — R1310 Dysphagia, unspecified: Secondary | ICD-10-CM

## 2014-05-29 ENCOUNTER — Ambulatory Visit
Admission: RE | Admit: 2014-05-29 | Discharge: 2014-05-29 | Disposition: A | Discharge status: Home / Self Care | Payer: Medicare Other | Source: Ambulatory Visit | Attending: Obstetrics and Gynecology | Admitting: Obstetrics and Gynecology

## 2014-05-29 DIAGNOSIS — R1310 Dysphagia, unspecified: Secondary | ICD-10-CM

## 2014-06-12 NOTE — Progress Notes (Signed)
Cardiology Office Note    Date:  06/13/2014   ID:  Wendy Velez, DOB 21-Oct-1941, MRN [id number]  PCP:  Kevin FentonBradshaw, Samuel, MD  Cardiologist:  Dr. Armanda Magicraci Turner      History of Present Illness: Wendy Velez is a 73 y.o. female with a hx of HTN, HL who saw Dr. Armanda Magicraci Turner recently for chest pain and an abnormal EKG.  Myoview was undertaken and was normal.  She was also noted to have PVCs on exam and she under went a Holter.  This demonstrated PAC/PVCs and short run of ATach.  She was placed on low dose beta blocker.  She returns for follow up.  She is doing well.  The patient denies chest pain, shortness of breath, syncope, orthopnea, PND or significant pedal edema.    ETT-Myoview 04/30/14: Overall Impression: Low risk stress nuclear study. Poor exercise tolerance. No evidence of ischemia.  LV Ejection Fraction: 60%.   Recent Labs: 01/02/2014: ALT 12; Creatinine 0.76; HDL Cholesterol by NMR 45; LDL (calc) 119*; Potassium 3.9; TSH 5.503*  04/02/2014: Hemoglobin 12.2   Wt Readings from Last 3 Encounters:  06/13/14 188 lb 3.2 oz (85.367 kg)  05/21/14 188 lb (85.276 kg)  05/13/14 190 lb (86.183 kg)     Past Medical History  Diagnosis Date  . Hypertension   . Hyperlipidemia     Current Outpatient Prescriptions  Medication Sig Dispense Refill  . amLODipine (NORVASC) 10 MG tablet Take 1 tablet (10 mg total) by mouth daily.  30 tablet  5  . hydrochlorothiazide (HYDRODIURIL) 25 MG tablet Take 1 tablet (25 mg total) by mouth daily.  31 tablet  11  . metoprolol succinate (TOPROL XL) 25 MG 24 hr tablet Take 1 tablet (25 mg total) by mouth daily.  30 tablet  6  . Nutritional Supplements (ESTROVEN PO) Take by mouth. Has black cohosh      . pravastatin (PRAVACHOL) 40 MG tablet Take 1 tablet (40 mg total) by mouth daily.  90 tablet  3   No current facility-administered medications for this visit.    Allergies:   Review of patient's allergies indicates no known allergies.    Social History:  The patient  reports that she has never smoked. She has never used smokeless tobacco. She reports that she drinks about 4.2 ounces of alcohol per week. She reports that she does not use illicit drugs.   Family History:  The patient's family history includes Alzheimer's disease in her mother, paternal aunt, and paternal uncle; Diabetes in her son. There is no history of Colon cancer.   ROS:  Please see the history of present illness.      All other systems reviewed and negative.   PHYSICAL EXAM: VS:  BP 140/80  Pulse 89  Ht 5\' 8"  (1.727 m)  Wt 188 lb 3.2 oz (85.367 kg)  BMI 28.62 kg/m2 Well nourished, well developed, in no acute distress HEENT: normal Neck: no JVD Cardiac:  normal S1, S2; RRR; 1/6 systolic murmur at RUSB Lungs:  clear to auscultation bilaterally, no wheezing, rhonchi or rales Abd: soft, nontender, no hepatomegaly Ext: no edema Skin: warm and dry Neuro:  CNs 2-12 intact, no focal abnormalities noted  EKG:  NSR, HR 89, normal axis, NSSTTW changes     ASSESSMENT AND PLAN:  1. Chest pain, atypical:  No recurrence.  Myoview low risk.  No further workup needed. 2. Atrial tachycardia:  No palpitation symptoms.  She is tolerating low dose beta blocker.  If she has symptoms of palpitations, would suggest increasing Toprol-XL as her BP and HR could tolerate. 3. Murmur:  Probably Aortic Sclerosis.  Will check Echo. 4. Essential hypertension, benign:  Controlled.  5. HLD (hyperlipidemia) :  She remains on statin. 6. Disposition:  F/u with Dr. Armanda Magic in 6 mos.   Signed, Brynda Rim, MHS 06/13/2014 8:52 AM    [facility] [address] [place], [place], [place]  [address] Phone: [phone]; Fax: [phone]

## 2014-06-13 ENCOUNTER — Ambulatory Visit (INDEPENDENT_AMBULATORY_CARE_PROVIDER_SITE_OTHER): Payer: Medicare Other | Admitting: Physician Assistant

## 2014-06-13 ENCOUNTER — Encounter: Payer: Self-pay | Admitting: Physician Assistant

## 2014-06-13 VITALS — BP 140/80 | HR 89 | Ht 68.0 in | Wt 188.2 lb

## 2014-06-13 DIAGNOSIS — E785 Hyperlipidemia, unspecified: Secondary | ICD-10-CM

## 2014-06-13 DIAGNOSIS — R0789 Other chest pain: Secondary | ICD-10-CM

## 2014-06-13 DIAGNOSIS — R011 Cardiac murmur, unspecified: Secondary | ICD-10-CM

## 2014-06-13 DIAGNOSIS — I498 Other specified cardiac arrhythmias: Secondary | ICD-10-CM

## 2014-06-13 DIAGNOSIS — I10 Essential (primary) hypertension: Secondary | ICD-10-CM

## 2014-06-13 DIAGNOSIS — I471 Supraventricular tachycardia: Secondary | ICD-10-CM

## 2014-06-13 NOTE — Patient Instructions (Signed)
Your physician has requested that you have an echocardiogram. Echocardiography is a painless test that uses sound waves to create images of your heart. It provides your doctor with information about the size and shape of your heart and how well your heart's chambers and valves are working. This procedure takes approximately one hour. There are no restrictions for this procedure.  Your physician wants you to follow-up in: 6 months with Dr. Turner. You will receive a reminder letter in the mail two months in advance. If you don't receive a letter, please call our office to schedule the follow-up appointment.  

## 2014-08-15 ENCOUNTER — Other Ambulatory Visit: Payer: Self-pay | Admitting: Family Medicine

## 2014-10-06 ENCOUNTER — Encounter: Payer: Self-pay | Admitting: Physician Assistant

## 2014-10-15 ENCOUNTER — Ambulatory Visit (HOSPITAL_COMMUNITY): Payer: Medicare Other | Attending: Internal Medicine

## 2014-10-15 DIAGNOSIS — R010 Benign and innocent cardiac murmurs: Secondary | ICD-10-CM | POA: Diagnosis not present

## 2014-10-15 DIAGNOSIS — E785 Hyperlipidemia, unspecified: Secondary | ICD-10-CM | POA: Diagnosis not present

## 2014-10-15 DIAGNOSIS — R0789 Other chest pain: Secondary | ICD-10-CM

## 2014-10-15 DIAGNOSIS — I10 Essential (primary) hypertension: Secondary | ICD-10-CM | POA: Insufficient documentation

## 2014-10-15 DIAGNOSIS — E119 Type 2 diabetes mellitus without complications: Secondary | ICD-10-CM | POA: Insufficient documentation

## 2014-10-15 DIAGNOSIS — R011 Cardiac murmur, unspecified: Secondary | ICD-10-CM

## 2014-10-15 NOTE — Progress Notes (Signed)
2D Echo completed. 10/15/2014  

## 2014-10-16 ENCOUNTER — Encounter: Payer: Self-pay | Admitting: Physician Assistant

## 2014-10-17 ENCOUNTER — Telehealth: Payer: Self-pay | Admitting: *Deleted

## 2014-10-17 DIAGNOSIS — I471 Supraventricular tachycardia: Secondary | ICD-10-CM

## 2014-10-17 MED ORDER — METOPROLOL SUCCINATE ER 50 MG PO TB24: 50.0000 mg | ORAL_TABLET | Freq: Every day | ORAL | Status: DC

## 2014-10-17 NOTE — Telephone Encounter (Signed)
Follow up      Pt states she was talking to the nurse and got disconnected.

## 2014-10-17 NOTE — Telephone Encounter (Signed)
pt notified about echo results. Pt states no to the question from Scott W. PA Coolin"anyone in her family every died suddenly (especially at a young age". Will increase Toprol to 50 mg daily, new Rx sent in to [facility]. Schedule 24 hour holter. 3-4 week Dr. Mayford Knifeurner

## 2014-10-17 NOTE — Telephone Encounter (Signed)
I rtnd pt's cb; pt thought I was transferring her to scheduling now to schedule for monitor and Dr. Mayford Knifeurner appt. and thought we got disconnected. I explained that I said I will have our office cb with date and time of monitor and Dr. Mayford Knifeurner appt. Pt said oh she was sorry she must have misunderstood. I said that was no problem.

## 2014-11-03 ENCOUNTER — Encounter: Payer: Self-pay | Admitting: *Deleted

## 2014-11-03 ENCOUNTER — Encounter (INDEPENDENT_AMBULATORY_CARE_PROVIDER_SITE_OTHER): Payer: Medicare Other

## 2014-11-03 DIAGNOSIS — I471 Supraventricular tachycardia: Secondary | ICD-10-CM

## 2014-11-03 NOTE — Progress Notes (Signed)
Patient ID: Wendy Velez, female   DOB: January 30, 1941, 73 y.o.   MRN: [id number] [employer] 24 hour holter monitor applied to patient.

## 2014-11-06 ENCOUNTER — Telehealth: Payer: Self-pay | Admitting: Cardiology

## 2014-11-06 NOTE — Telephone Encounter (Signed)
Please let patient know that heart monitor showed NSR with PVC's, PAC's, nonsustained atrial tachycardia.  Please find out if patient has been having palptiations

## 2014-11-12 NOTE — Telephone Encounter (Signed)
Left message to call back  

## 2014-11-12 NOTE — Telephone Encounter (Signed)
Patient informed of monitor results and verbal understanding expressed.   Patient st she is having no palpitations or symptoms.

## 2014-11-18 ENCOUNTER — Encounter: Payer: Self-pay | Admitting: Cardiology

## 2014-11-18 ENCOUNTER — Ambulatory Visit (INDEPENDENT_AMBULATORY_CARE_PROVIDER_SITE_OTHER): Payer: Medicare Other | Admitting: Cardiology

## 2014-11-18 VITALS — BP 128/70 | HR 76 | Resp 18 | Ht 68.0 in | Wt 198.8 lb

## 2014-11-18 DIAGNOSIS — I471 Supraventricular tachycardia: Secondary | ICD-10-CM

## 2014-11-18 DIAGNOSIS — I10 Essential (primary) hypertension: Secondary | ICD-10-CM

## 2014-11-18 DIAGNOSIS — R0789 Other chest pain: Secondary | ICD-10-CM

## 2014-11-18 DIAGNOSIS — R9431 Abnormal electrocardiogram [ECG] [EKG]: Secondary | ICD-10-CM

## 2014-11-18 LAB — BASIC METABOLIC PANEL
BUN: 16 mg/dL (ref 6–23)
CALCIUM: 9 mg/dL (ref 8.4–10.5)
CO2: 26 meq/L (ref 19–32)
Chloride: 99 mEq/L (ref 96–112)
Creatinine, Ser: 0.8 mg/dL (ref 0.4–1.2)
GFR: 84.93 mL/min (ref >60.00)
Glucose, Bld: 312 mg/dL — ABNORMAL HIGH (ref 70–99)
Potassium: 3.6 mEq/L (ref 3.5–5.1)
Sodium: 133 mEq/L — ABNORMAL LOW (ref 135–145)

## 2014-11-18 NOTE — Patient Instructions (Signed)
Your physician recommends that you have lab work TODAY (BMET).  Your physician wants you to follow-up in: 6 months with Dr. Turner. You will receive a reminder letter in the mail two months in advance. If you don't receive a letter, please call our office to schedule the follow-up appointment.  

## 2014-11-18 NOTE — Progress Notes (Signed)
[address] [place], [place]  [address] Phone: [phone] Fax:  [phone]  Date:  11/18/2014   ID:  Wendy Velez, DOB Apr 15, 1941, MRN [id number]  PCP:  Kevin FentonBradshaw, Samuel, MD  Cardiologist:  Armanda Magicraci Turner, MD    History of Present Illness: Wendy Velez is a 73 y.o. female with a history of dyslipidemia and HTN who presents today for evaluation.She underwent nuclear stress test for CP which showed no ischemia and 2D echo showed normal LVF with severe septal hypertrophy and chordal SAM but no LVOT resting gradient.  She was started on Toprol.  She has no family history of sudden cardiac death. Holter monitor showed NSR with PVC's and PAC's and nonsustained atrial tachycardia.  She now presents today for followup.  She denies any chest pain.  She occasionally has some mild DOE when going up stairs.  She denies any dizziness, LE edema, palpitations or syncope.     Wt Readings from Last 3 Encounters:  11/18/14 198 lb 12.8 oz (90.175 kg)  06/13/14 188 lb 3.2 oz (85.367 kg)  05/21/14 188 lb (85.276 kg)     Past Medical History  Diagnosis Date  . Hypertension   . Hyperlipidemia   . HOCM (hypertrophic obstructive cardiomyopathy)     a. Echo (11/15):  Mod LVH, mod to severe focal basal hypertrophy of septum, EF 60-65%, Gr 1 DD, Ao sclerosis no stenosis, chordal SAM, mild MR, mild LAE, atrial septal aneurysm with sugg of small PFO (consider bubble study) >> b. ETT-Myoview 6/15 with normal BP response to exercise    Current Outpatient Prescriptions  Medication Sig Dispense Refill  . amLODipine (NORVASC) 10 MG tablet TAKE 1 TABLET (10 MG TOTAL) BY MOUTH DAILY. 30 tablet 5  . hydrochlorothiazide (HYDRODIURIL) 25 MG tablet Take 1 tablet (25 mg total) by mouth daily. 31 tablet 11  . metoprolol succinate (TOPROL-XL) 50 MG 24 hr tablet Take 1 tablet (50 mg total) by mouth daily. 30 tablet 11  . Nutritional Supplements (ESTROVEN PO) Take by mouth. Has black cohosh    .  pravastatin (PRAVACHOL) 40 MG tablet Take 1 tablet (40 mg total) by mouth daily. 90 tablet 3   No current facility-administered medications for this visit.    Allergies:   No Known Allergies  Social History:  The patient  reports that she has never smoked. She has never used smokeless tobacco. She reports that she drinks about 4.2 oz of alcohol per week. She reports that she does not use illicit drugs.   Family History:  The patient's family history includes Alzheimer's disease in her mother, paternal aunt, and paternal uncle; Diabetes in her son. There is no history of Colon cancer.   ROS:  Please see the history of present illness.      All other systems reviewed and negative.   PHYSICAL EXAM: VS:  BP 128/70 mmHg  Pulse 76  Resp 18  Ht 5\' 8"  (1.727 m)  Wt 198 lb 12.8 oz (90.175 kg)  BMI 30.23 kg/m2  SpO2 98% Well nourished, well developed, in no acute distress HEENT: normal Neck: no JVD Cardiac:  normal S1, S2; RRR; no murmur Lungs:  clear to auscultation bilaterally, no wheezing, rhonchi or rales Abd: soft, nontender, no hepatomegaly Ext: no edema Skin: warm and dry Neuro:  CNs 2-12 intact, no focal abnormalities noted  ASSESSMENT AND PLAN:  1. Chest pain, atypical: No recurrence. Myoview low risk. No further workup needed. 2. Atrial tachycardia: No palpitation symptoms.  She is tolerating low dose beta blocker. If she has symptoms of palpitations, would suggest increasing Toprol-XL as her BP and HR could tolerate. 3. 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   4. Essential hypertension, benign: Controlled. Continue amlodipine/HCTZ/Toprol.  Check BMET 5. HLD (hyperlipidemia) : She remains on statin.   Followup with me in 6 months  Signed, Armanda Magicraci Turner, MD [facility] 11/18/2014 11:37 AM

## 2014-11-20 ENCOUNTER — Telehealth: Payer: Self-pay | Admitting: Cardiology

## 2014-11-20 NOTE — Telephone Encounter (Signed)
New message ° ° ° ° °Returning Katy's call °

## 2014-11-20 NOTE — Telephone Encounter (Signed)
Returned call to patient she stated she was returning Dr.Turner's nurses call.Advised Katy not in office today.Will send message to her.

## 2014-11-21 NOTE — Telephone Encounter (Signed)
Patient informed of lab results and verbal understanding expressed.   

## 2014-12-15 ENCOUNTER — Other Ambulatory Visit: Payer: Self-pay | Admitting: Family Medicine

## 2014-12-15 ENCOUNTER — Telehealth: Payer: Self-pay | Admitting: *Deleted

## 2014-12-15 ENCOUNTER — Ambulatory Visit (INDEPENDENT_AMBULATORY_CARE_PROVIDER_SITE_OTHER): Payer: Medicare Other | Admitting: Family Medicine

## 2014-12-15 ENCOUNTER — Encounter: Payer: Self-pay | Admitting: Family Medicine

## 2014-12-15 VITALS — BP 155/90 | HR 92 | Ht 68.0 in | Wt 195.0 lb

## 2014-12-15 DIAGNOSIS — R358 Other polyuria: Secondary | ICD-10-CM

## 2014-12-15 DIAGNOSIS — Z23 Encounter for immunization: Secondary | ICD-10-CM

## 2014-12-15 DIAGNOSIS — E119 Type 2 diabetes mellitus without complications: Secondary | ICD-10-CM

## 2014-12-15 DIAGNOSIS — R3589 Other polyuria: Secondary | ICD-10-CM

## 2014-12-15 DIAGNOSIS — I10 Essential (primary) hypertension: Secondary | ICD-10-CM

## 2014-12-15 DIAGNOSIS — Z0000 Encounter for general adult medical examination without abnormal findings: Secondary | ICD-10-CM

## 2014-12-15 LAB — POCT URINALYSIS DIPSTICK
BILIRUBIN UA: NEGATIVE
GLUCOSE UA: NEGATIVE
KETONES UA: NEGATIVE
NITRITE UA: NEGATIVE
PH UA: 6.5
Protein, UA: NEGATIVE
Spec Grav, UA: 1.02
Urobilinogen, UA: 0.2

## 2014-12-15 LAB — POCT GLYCOSYLATED HEMOGLOBIN (HGB A1C): Hemoglobin A1C: 9

## 2014-12-15 MED ORDER — METFORMIN HCL 1000 MG PO TABS: 1000.0000 mg | ORAL_TABLET | Freq: Two times a day (BID) | ORAL | Status: DC

## 2014-12-15 MED ORDER — ZOSTER VACCINE LIVE 19400 UNT/0.65ML ~~LOC~~ SOLR: 0.6500 mL | Freq: Once | SUBCUTANEOUS | Status: DC

## 2014-12-15 MED ORDER — TETANUS-DIPHTH-ACELL PERTUSSIS 5-2.5-18.5 LF-MCG/0.5 IM SUSP: 0.5000 mL | Freq: Once | INTRAMUSCULAR | Status: DC

## 2014-12-15 NOTE — Assessment & Plan Note (Addendum)
Likely diabetes related Infection low likelihood given her symptoms, however she does have trace intact blood and large leukocyte esterase today, send for culture Also send for protein creatinine ratio to assess for microalbuminuria Wendy Velez sample not large enough to give micro-exam today, chose culture and protein creatinine ratio

## 2014-12-15 NOTE — Assessment & Plan Note (Signed)
Slightly elevated today Continue HCTZ, metoprolol, amlodipine no red flags Follow-up next month

## 2014-12-15 NOTE — Telephone Encounter (Signed)
Called and informed patient of her appointment to Dr Nile RiggsShapiro on 01/05/2015 at 10:30 for diabetic eye exam.Busick, Rodena Medinobert Lee

## 2014-12-15 NOTE — Progress Notes (Addendum)
Patient ID: Wendy Velez, female   DOB: 1941/02/17, 74 y.o.   MRN: [id number]   HPI  Patient presents today for follow-up diabetes, polyuria  Diabetes Patient states that she's not exercising like she was previously, not really watching her diet anymore Describes polyuria and polydipsia intermittently for the last 6-8 weeks. Not taking any medications at this time, does not want to use insulin if it can be avoided Her blood sugars in the last 6 weeks have ranged from 200-250 Prior to the last 6 weeks her blood sugars ranged from 150-175.   Hypertension Taking meds everyday No chest pain, dyspnea, palpitations, leg edema  Polyuria States it has been going on for last 6-8 weeks Denies fever, chills, change in by mouth intake or appetite, and ill feeling.   Healthcare maint Had mammogram 3 in the last year [facility] Prevnar today, would like to get Zostavax and TDap  Smoking status noted ROS: Per HPI  Objective: BP 155/90 mmHg  Pulse 92  Ht 5\' 8"  (1.727 m)  Wt 195 lb (88.451 kg)  BMI 29.66 kg/m2 Gen: NAD, alert, cooperative with exam HEENT: NCAT CV: RRR, good S1/S2, no murmur Resp: CTABL, no wheezes, non-labored Ext: No edema, warm Neuro: Alert and oriented, No gross deficits  Diabetic foot exam: 2+ DP pulses, no lesions, grossly intact sensation  Assessment and plan:  DM2 (diabetes mellitus, type 2) Control slipping, A1c change from 7.0 to 9.0 Discussed diet and exercise Start metformin 1 g twice a day Check protein creatinine ratio, referred ophthalmology Continue statin log fasting blood sugars, follow-up one month  Essential hypertension, benign Slightly elevated today Continue HCTZ, metoprolol, amlodipine no red flags Follow-up next month   Healthcare maintenance Mammogram done last year Colonoscopy up-to-date Prevnar 13 given today, Rx written for Zostavax and TDap Referral for ophthalmology  written   Polyuria Likely diabetes related Infection low likelihood given her symptoms, however she does have trace intact blood and large leukocyte esterase today, send for culture Also send for protein creatinine ratio to assess for microalbuminuria Clifton CustardAaron sample not large enough to give micro-exam today, chose culture and protein creatinine ratio    Orders Placed This Encounter  Procedures  . Protein / Creatinine Ratio, Urine  . Ambulatory referral to Ophthalmology    Referral Priority:  Routine    Referral Type:  Consultation    Referral Reason:  Specialty Services Required    Requested Specialty:  Ophthalmology    Number of Visits Requested:  1  . POCT HgB A1C  . Urinalysis Dipstick    Meds ordered this encounter  Medications  . metFORMIN (GLUCOPHAGE) 1000 MG tablet    Sig: Take 1 tablet (1,000 mg total) by mouth 2 (two) times daily with a meal.    Dispense:  180 tablet    Refill:  3  . Tdap (BOOSTRIX) 5-2.5-18.5 LF-MCG/0.5 injection    Sig: Inject 0.5 mLs into the muscle once.    Dispense:  0.5 mL    Refill:  0  . zoster vaccine live, PF, (ZOSTAVAX) [id number] UNT/0.65ML injection    Sig: Inject 19,400 Units into the skin once.    Dispense:  1 each    Refill:  0

## 2014-12-15 NOTE — Assessment & Plan Note (Signed)
Mammogram done last year Colonoscopy up-to-date Prevnar 13 given today, Rx written for Zostavax and TDap Referral for ophthalmology written

## 2014-12-15 NOTE — Patient Instructions (Addendum)
Great to see you ! Lets follow up in 1 month  Your A1C is elevated to 9 which reflects an average blood sugar of 210.   Lets start metformin and try to watch your diet, try to walk agin  I have ordered some labs, I will contact you with the results  You need an eye doctors appointment for your diabetes  Diet Recommendations for Diabetes   Starchy (carb) foods include: Bread, rice, pasta, potatoes, corn, crackers, bagels, muffins, all baked goods.   Protein foods include: Meat, fish, poultry, eggs, dairy foods, and beans such as pinto and kidney beans (beans also provide carbohydrate).   1. Eat at least 3 meals and 1-2 snacks per day. Never go more than 4-5 hours while awake without eating.  2. Limit starchy foods to TWO per meal and ONE per snack. ONE portion of a starchy  food is equal to the following:   - ONE slice of bread (or its equivalent, such as half of a hamburger bun).   - 1/2 cup of a "scoopable" starchy food such as potatoes or rice.   - 1 OUNCE (28 grams) of starchy snack foods such as crackers or pretzels (look on label).   - 15 grams of carbohydrate as shown on food label.  3. Both lunch and dinner should include a protein food, a carb food, and vegetables.   - Obtain twice as many veg's as protein or carbohydrate foods for both lunch and dinner.   - Try to keep frozen veg's on hand for a quick vegetable serving.     - Fresh or frozen veg's are best.  4. Breakfast should always include protein.

## 2014-12-15 NOTE — Assessment & Plan Note (Signed)
Control slipping, A1c change from 7.0 to 9.0 Discussed diet and exercise Start metformin 1 g twice a day Check protein creatinine ratio, referred ophthalmology Continue statin log fasting blood sugars, follow-up one month

## 2014-12-16 LAB — PROTEIN / CREATININE RATIO, URINE
Creatinine, Urine: 198.9 mg/dL
Protein Creatinine Ratio: 0.1 (ref <0.15)
Total Protein, Urine: 19 mg/dL (ref 5–24)

## 2014-12-18 ENCOUNTER — Telehealth: Payer: Self-pay | Admitting: Family Medicine

## 2014-12-18 LAB — URINE CULTURE: Colony Count: 80000

## 2014-12-18 NOTE — Telephone Encounter (Signed)
Urine culture reviewed, no significant bacterial growth. Will ask nursing to inform.   Murtis SinkSam Bradshaw, MD [facility] Resident, PGY-3 12/18/2014, 8:32 AM

## 2014-12-18 NOTE — Telephone Encounter (Signed)
Pt informed. Rhyatt Muska, CMA  

## 2014-12-19 IMAGING — MG MM SCREENING BREAST TOMO BILATERAL
8 of 11 series · 8 of 27 positions shown · non-contrast
Comparison: None.

CLINICAL DATA: Screening.

EXAM:
DIGITAL SCREENING BILATERAL MAMMOGRAM WITH 3D TOMO WITH CAD
DIGITAL BREAST TOMOSYNTHESIS
Digital breast tomosynthesis images are acquired in two projections.
These images are reviewed in combination with the digital mammogram,
confirming the findings below.

[L MLO]
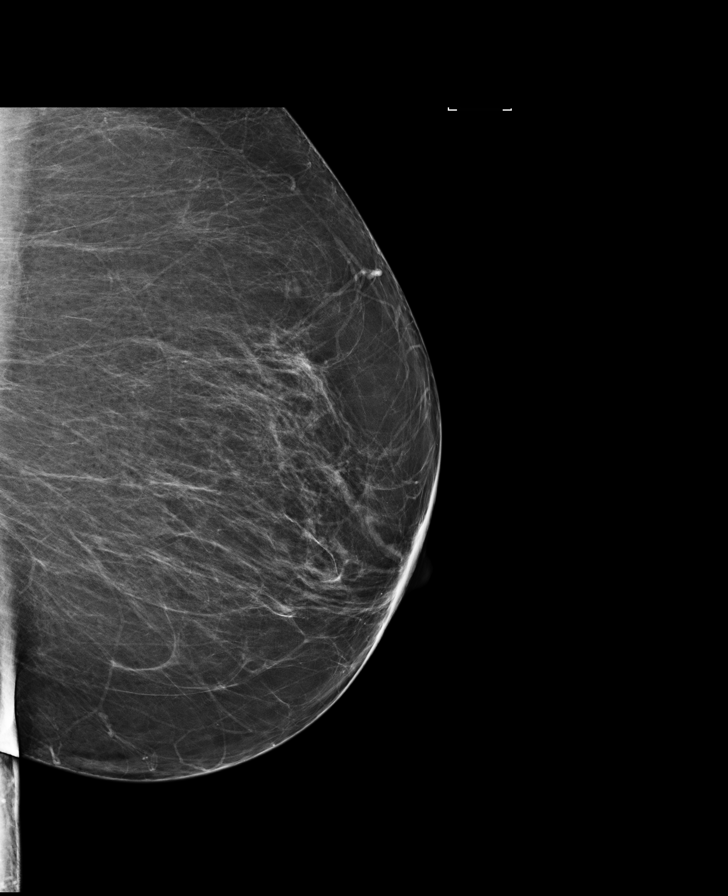

[L CC]
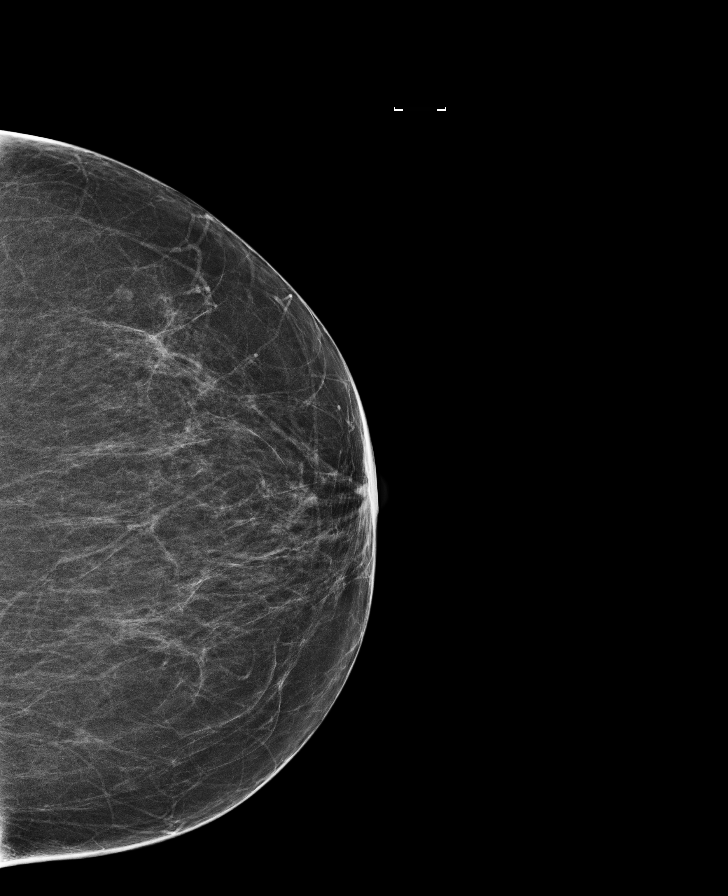

[R CC]
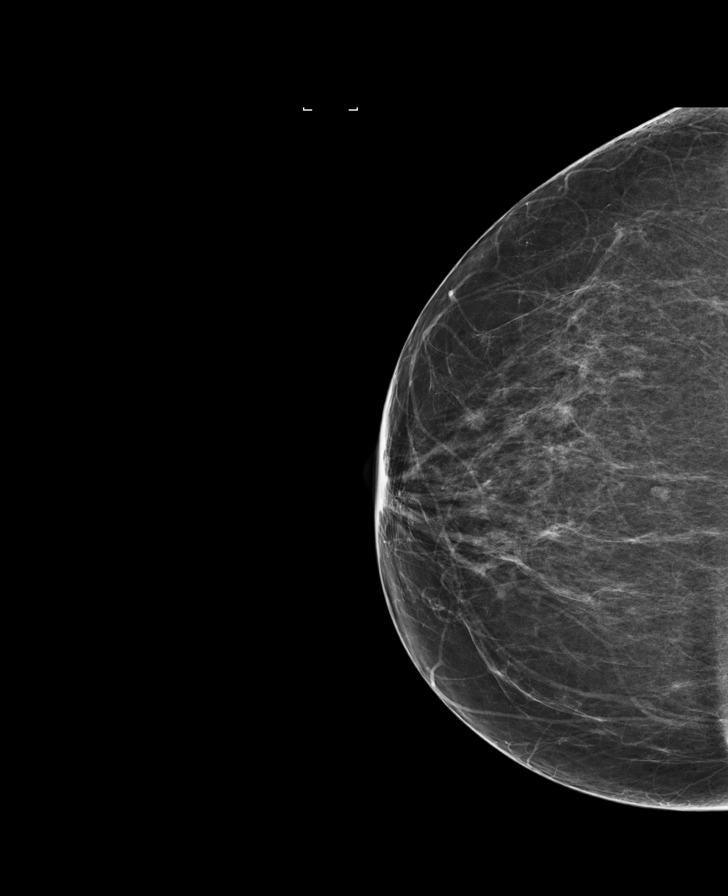

[R MLO]
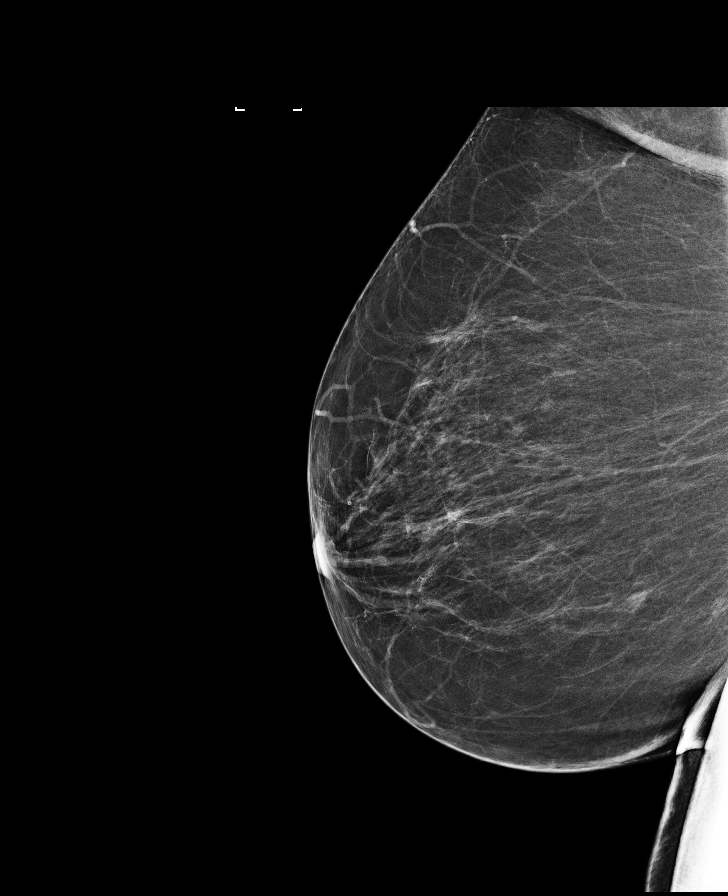

[R CC tomo · tomo slice 33/65.0]
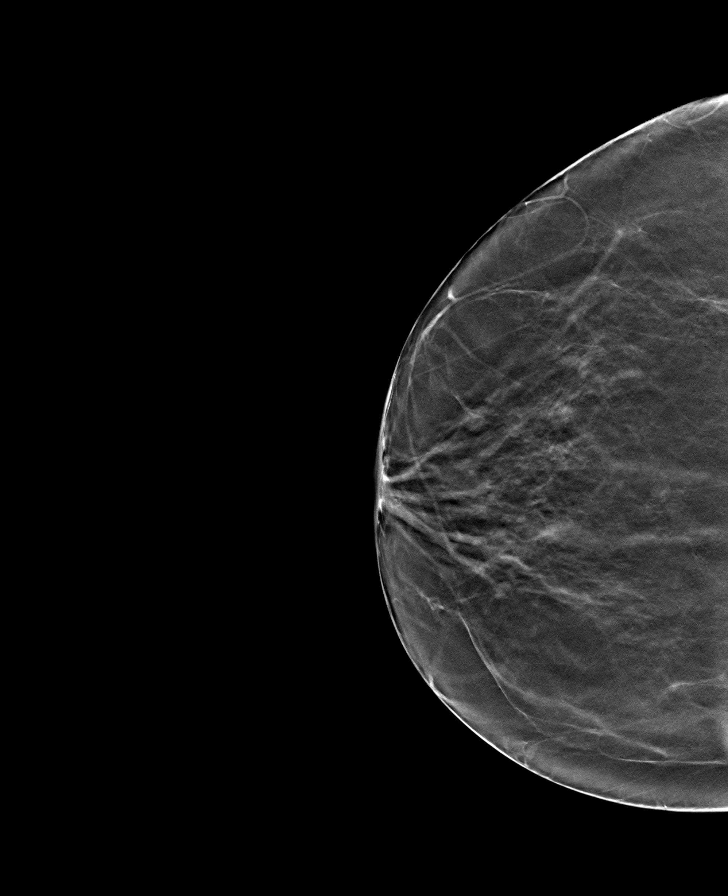

[R CC synth-2D]
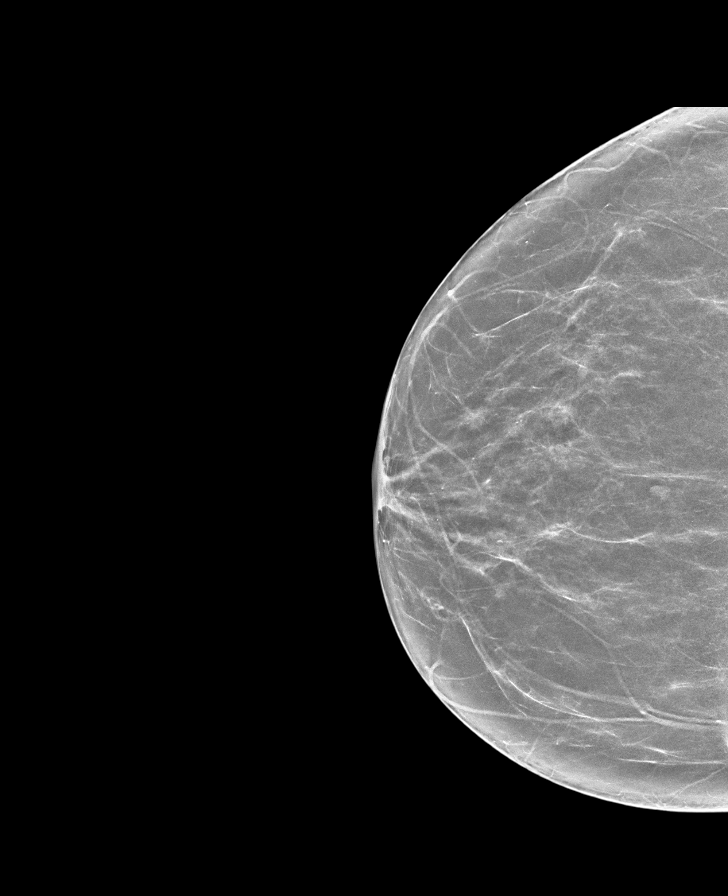

[R MLO synth-2D]
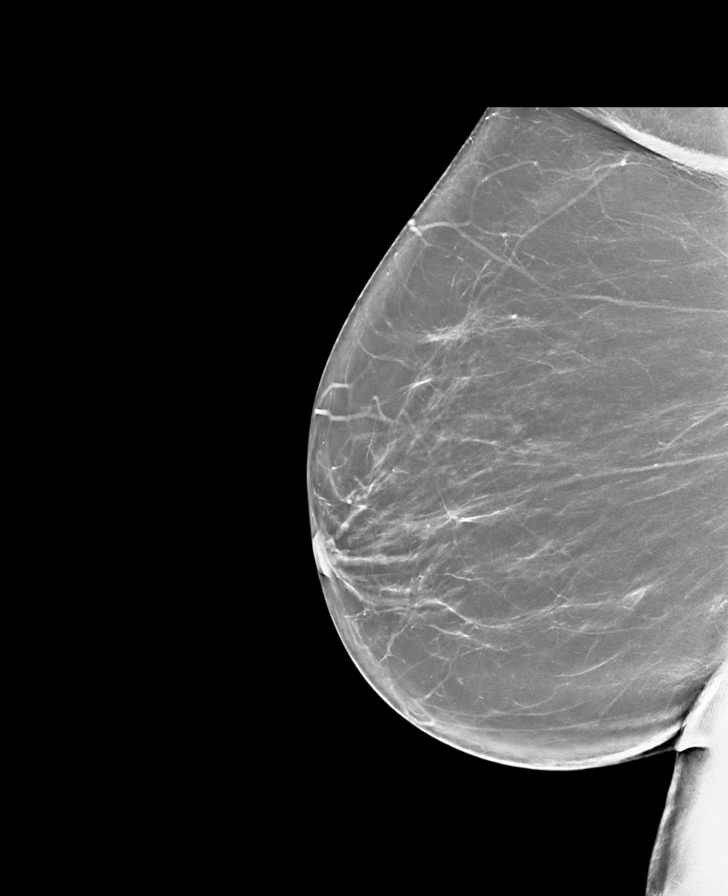

[L CC synth-2D]
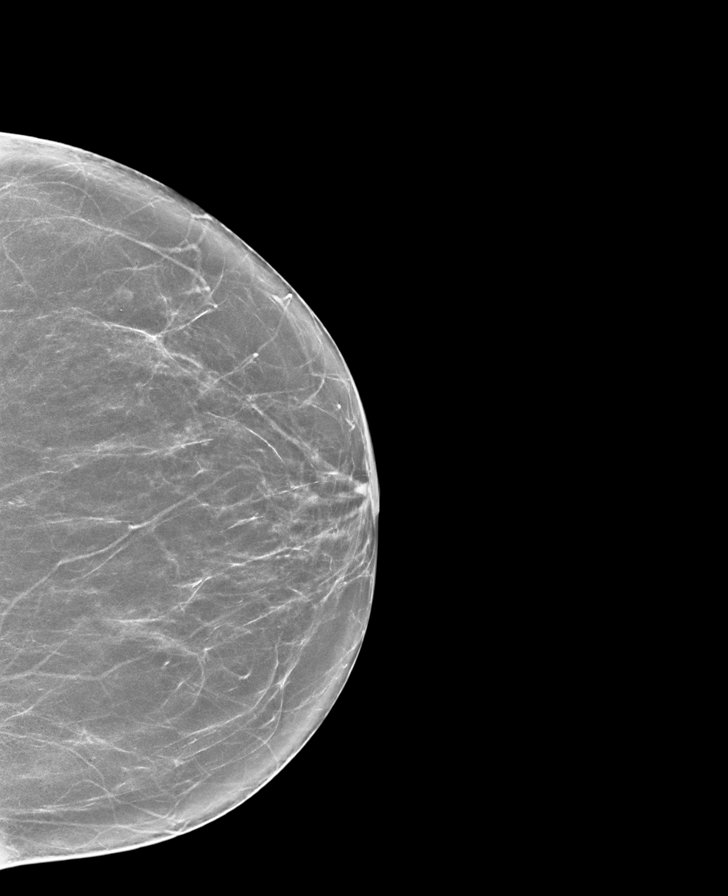

[8 of 27 positions shown; findings below may reference images not displayed]

ACR Breast Density Category b: There are scattered areas of
fibroglandular density.
FINDINGS: There are no findings suspicious for malignancy. Images were
processed with CAD.
IMPRESSION: No mammographic evidence of malignancy. A result letter of this
screening mammogram will be mailed directly to the patient.

RECOMMENDATION:
Screening mammogram in one year. (Code:ZI-W-JZ9)

BI-RADS CATEGORY  1: Negative.

## 2015-01-02 ENCOUNTER — Telehealth: Payer: Self-pay | Admitting: Family Medicine

## 2015-01-02 DIAGNOSIS — E119 Type 2 diabetes mellitus without complications: Secondary | ICD-10-CM

## 2015-01-02 MED ORDER — GLUCOSE BLOOD VI STRP: 1.0000 | ORAL_STRIP | Status: AC

## 2015-01-02 MED ORDER — ONETOUCH LANCETS MISC: 1.0000 | Status: AC

## 2015-01-02 MED ORDER — ONETOUCH ULTRA 2 W/DEVICE KIT: 1.0000 | PACK | Status: AC | PRN

## 2015-01-02 NOTE — Telephone Encounter (Signed)
Called husband to discuss diabetic med and she requests meter.   Will send to pharnacy.   Murtis SinkSam Bradshaw, MD [facility] Resident, PGY-3 01/02/2015, 2:22 PM

## 2015-01-21 ENCOUNTER — Encounter: Payer: Self-pay | Admitting: Family Medicine

## 2015-01-21 ENCOUNTER — Ambulatory Visit (INDEPENDENT_AMBULATORY_CARE_PROVIDER_SITE_OTHER): Payer: Medicare Other | Admitting: Family Medicine

## 2015-01-21 VITALS — BP 158/73 | HR 86 | Temp 98.4°F | Ht 68.0 in | Wt 198.1 lb

## 2015-01-21 DIAGNOSIS — E119 Type 2 diabetes mellitus without complications: Secondary | ICD-10-CM

## 2015-01-21 DIAGNOSIS — I10 Essential (primary) hypertension: Secondary | ICD-10-CM

## 2015-01-21 DIAGNOSIS — E785 Hyperlipidemia, unspecified: Secondary | ICD-10-CM

## 2015-01-21 LAB — COMPREHENSIVE METABOLIC PANEL
ALBUMIN: 3.9 g/dL (ref 3.5–5.2)
ALT: 9 U/L (ref 0–35)
AST: 13 U/L (ref 0–37)
Alkaline Phosphatase: 80 U/L (ref 39–117)
BUN: 16 mg/dL (ref 6–23)
CALCIUM: 9.7 mg/dL (ref 8.4–10.5)
CHLORIDE: 102 meq/L (ref 96–112)
CO2: 29 meq/L (ref 19–32)
Creat: 0.75 mg/dL (ref 0.50–1.10)
GLUCOSE: 128 mg/dL — AB (ref 70–99)
Potassium: 4 mEq/L (ref 3.5–5.3)
SODIUM: 141 meq/L (ref 135–145)
TOTAL PROTEIN: 7 g/dL (ref 6.0–8.3)
Total Bilirubin: 0.6 mg/dL (ref 0.2–1.2)

## 2015-01-21 MED ORDER — LISINOPRIL-HYDROCHLOROTHIAZIDE 20-25 MG PO TABS: 1.0000 | ORAL_TABLET | Freq: Every day | ORAL | Status: DC

## 2015-01-21 MED ORDER — PRAVASTATIN SODIUM 40 MG PO TABS: 40.0000 mg | ORAL_TABLET | Freq: Every day | ORAL | Status: DC

## 2015-01-21 NOTE — Patient Instructions (Addendum)
Great to see you!  Lets change hydrochlorothiazide to Prinzide, this has both hydrochlorothiazide and lisinopril. Because of the medication we will need to repeat your labs next time you come in.  Please follow-up in 2 months  Diet Recommendations for Diabetes   Starchy (carb) foods include: Bread, rice, pasta, potatoes, corn, crackers, bagels, muffins, all baked goods.   Protein foods include: Meat, fish, poultry, eggs, dairy foods, and beans such as pinto and kidney beans (beans also provide carbohydrate).   1. Eat at least 3 meals and 1-2 snacks per day. Never go more than 4-5 hours while awake without eating.  2. Limit starchy foods to TWO per meal and ONE per snack. ONE portion of a starchy  food is equal to the following:   - ONE slice of bread (or its equivalent, such as half of a hamburger bun).   - 1/2 cup of a "scoopable" starchy food such as potatoes or rice.   - 1 OUNCE (28 grams) of starchy snack foods such as crackers or pretzels (look on label).   - 15 grams of carbohydrate as shown on food label.  3. Both lunch and dinner should include a protein food, a carb food, and vegetables.   - Obtain twice as many veg's as protein or carbohydrate foods for both lunch and dinner.   - Try to keep frozen veg's on hand for a quick vegetable serving.     - Fresh or frozen veg's are best.  4. Breakfast should always include protein.

## 2015-01-21 NOTE — Assessment & Plan Note (Addendum)
Still slightly elevated Continue amlodipine, hydrochlorothiazide, metoprolol Considering diabetes would like to start ACE inhibitor Start lisinopril, DC hydrochlorothiazide and start Prinzide Follow-up 2 months, repeat labs then

## 2015-01-21 NOTE — Assessment & Plan Note (Signed)
Troll improved with metformin Continue metformin Discussed diet and exercise Follow-up 2 months

## 2015-01-21 NOTE — Progress Notes (Signed)
Patient ID: Wendy Velez, female   DOB: [birth year], 74 y.o.   MRN: [id number]   HPI  Patient presents today for follow-up  Diabetes Patient tolerating metformin easily, initially had upset stomach so that medication in half and then increased to 1 g twice a day slowly. Watching her diet No exercise Blood sugars fasting running 110 to 130.  Hypertension Occasional headache, no persistent headaches No chest pain, dyspnea, palpitations, leg edema Taking meds everyday Needs refill of HCTZ, changed Prinzide today  Smoking status noted ROS: Per HPI  Objective: BP 158/73 mmHg  Pulse 86  Temp(Src) 98.4 F (36.9 C) (Oral)  Ht 5\' 8"  (1.727 m)  Wt 198 lb 1.6 oz (89.858 kg)  BMI 30.13 kg/m2 Gen: NAD, alert, cooperative with exam HEENT: NCAT CV: RRR, good S1/S2, no murmur Resp: CTABL, no wheezes, non-labored Abd: SNTND, BS present, no guarding or organomegaly Neuro: Alert and oriented, No gross deficits  Assessment and plan:  DM2 (diabetes mellitus, type 2) Troll improved with metformin Continue metformin Discussed diet and exercise Follow-up 2 months   Essential hypertension, benign Still slightly elevated Continue amlodipine, hydrochlorothiazide, metoprolol Considering diabetes would like to start ACE inhibitor Start lisinopril, DC hydrochlorothiazide and start Prinzide Follow-up 2 months, repeat labs then        Meds ordered this encounter  Medications  . lisinopril-hydrochlorothiazide (PRINZIDE,ZESTORETIC) 20-25 MG per tablet    Sig: Take 1 tablet by mouth daily.    Dispense:  30 tablet    Refill:  5    Discontinue hydrochlorothiazide prescription  . pravastatin (PRAVACHOL) 40 MG tablet    Sig: Take 1 tablet (40 mg total) by mouth daily.    Dispense:  90 tablet    Refill:  3

## 2015-01-22 ENCOUNTER — Encounter: Payer: Self-pay | Admitting: Family Medicine

## 2015-01-23 ENCOUNTER — Other Ambulatory Visit: Payer: Self-pay | Admitting: Family Medicine

## 2015-02-13 ENCOUNTER — Encounter: Payer: Self-pay | Admitting: Cardiology

## 2015-02-17 ENCOUNTER — Other Ambulatory Visit: Payer: Self-pay | Admitting: Family Medicine

## 2015-03-05 ENCOUNTER — Encounter: Payer: Self-pay | Admitting: Family Medicine

## 2015-03-14 ENCOUNTER — Other Ambulatory Visit: Payer: Self-pay | Admitting: Family Medicine

## 2015-03-24 ENCOUNTER — Encounter: Payer: Self-pay | Admitting: Family Medicine

## 2015-03-24 ENCOUNTER — Ambulatory Visit (INDEPENDENT_AMBULATORY_CARE_PROVIDER_SITE_OTHER): Payer: Medicare Other | Admitting: Family Medicine

## 2015-03-24 VITALS — BP 138/82 | HR 80 | Temp 98.1°F | Ht 68.0 in | Wt 190.0 lb

## 2015-03-24 DIAGNOSIS — L03211 Cellulitis of face: Secondary | ICD-10-CM

## 2015-03-24 DIAGNOSIS — E119 Type 2 diabetes mellitus without complications: Secondary | ICD-10-CM

## 2015-03-24 DIAGNOSIS — L0390 Cellulitis, unspecified: Secondary | ICD-10-CM | POA: Insufficient documentation

## 2015-03-24 DIAGNOSIS — I10 Essential (primary) hypertension: Secondary | ICD-10-CM

## 2015-03-24 LAB — BASIC METABOLIC PANEL
BUN: 17 mg/dL (ref 6–23)
CALCIUM: 9.7 mg/dL (ref 8.4–10.5)
CO2: 26 mEq/L (ref 19–32)
CREATININE: 0.84 mg/dL (ref 0.50–1.10)
Chloride: 100 mEq/L (ref 96–112)
GLUCOSE: 148 mg/dL — AB (ref 70–99)
Potassium: 3.9 mEq/L (ref 3.5–5.3)
Sodium: 136 mEq/L (ref 135–145)

## 2015-03-24 LAB — POCT GLYCOSYLATED HEMOGLOBIN (HGB A1C): HEMOGLOBIN A1C: 6.8

## 2015-03-24 MED ORDER — LISINOPRIL 20 MG PO TABS: 20.0000 mg | ORAL_TABLET | Freq: Every day | ORAL | Status: DC

## 2015-03-24 MED ORDER — CEPHALEXIN 500 MG PO CAPS: 500.0000 mg | ORAL_CAPSULE | Freq: Three times a day (TID) | ORAL | Status: DC

## 2015-03-24 NOTE — Patient Instructions (Signed)
Great to see you!  Please call in later today and leave us a list of your medicines, I will look at it and get back to you in a few days when your labs get back  If your face doesn't start improving in the next 2 days please contact us, please seek help right away if it gets worse or if you begin developing fever or are not tolerating food or fluid by mouth.   Cellulitis Cellulitis is an infection of the skin and the tissue beneath it. The infected area is usually red and tender. Cellulitis occurs most often in the arms and lower legs.  CAUSES  Cellulitis is caused by bacteria that enter the skin through cracks or cuts in the skin. The most common types of bacteria that cause cellulitis are staphylococci and streptococci. SIGNS AND SYMPTOMS   Redness and warmth.  Swelling.  Tenderness or pain.  Fever. DIAGNOSIS  Your health care provider can usually determine what is wrong based on a physical exam. Blood tests may also be done. TREATMENT  Treatment usually involves taking an antibiotic medicine. HOME CARE INSTRUCTIONS   Take your antibiotic medicine as directed by your health care provider. Finish the antibiotic even if you start to feel better.  Keep the infected arm or leg elevated to reduce swelling.  Apply a warm cloth to the affected area up to 4 times per day to relieve pain.  Take medicines only as directed by your health care provider.  Keep all follow-up visits as directed by your health care provider. SEEK MEDICAL CARE IF:   You notice red streaks coming from the infected area.  Your red area gets larger or turns dark in color.  Your bone or joint underneath the infected area becomes painful after the skin has healed.  Your infection returns in the same area or another area.  You notice a swollen bump in the infected area.  You develop new symptoms.  You have a fever. SEEK IMMEDIATE MEDICAL CARE IF:   You feel very sleepy.  You develop vomiting or  diarrhea.  You have a general ill feeling (malaise) with muscle aches and pains. MAKE SURE YOU:   Understand these instructions.  Will watch your condition.  Will get help right away if you are not doing well or get worse. Document Released: 08/31/2005 Document Revised: 04/07/2014 Document Reviewed: 02/06/2012 [facility] Patient Information 2015 [employer] [place]. This information is not intended to replace advice given to you by your health care provider. Make sure you discuss any questions you have with your health care provider.

## 2015-03-24 NOTE — Assessment & Plan Note (Addendum)
L facial cellulitis, early and mild NO systemic symptoms other than malaise Tx with keflex, avoid braod spectrum in elderly but gave strict return/callback precautions No Hx of MRSA

## 2015-03-24 NOTE — Assessment & Plan Note (Addendum)
Controlled Taking prinzide, HCTZ, metop, and norvasc She did not stop HCTZ and is unsure of lisinopril/HCTZ Just filled HCTZ so wants to continue and start lisinopril alone Labs today

## 2015-03-24 NOTE — Assessment & Plan Note (Signed)
Improved, well controlled Foot exam normal today Waiting on note from ophtho Continue metformin Encouraged and congratulated on success

## 2015-03-24 NOTE — Progress Notes (Signed)
Patient ID: Wendy Velez, female   DOB: 29-Oct-1941, 74 y.o.   MRN: [id number]   HPI  Patient presents today for follow-up hypertension, diabetes, sinus infection  Sinus infection notes 3 days of runny left nostril with some facial swelling and tenderness. She has malaise but denies any fevers, chills, or loss of appetite. Does not have recurrent sinus infections.  Hypertension No chest pain, dyspnea, palpitations, leg edema, headache Taking meds everyday, notably she's not sure she is taking metoprolol. Also she is taking hydrochlorothiazide plus Prinzide, we discussed stopping hydrochlorothiazide and how confusing it can be as Prinzide has hydrochlorothiazide and lisinopril in it. No cough  Diabetes Watching her diet closely, taking 1 g of metformin the morning and 500 mg at night fastings CBGs are 110-130 No feet problems  PMH: Smoking status noted ROS: Per HPI  Objective: BP 138/82 mmHg  Pulse 80  Temp(Src) 98.1 F (36.7 C) (Oral)  Ht 5\' 8"  (1.727 m)  Wt 190 lb (86.183 kg)  BMI 28.90 kg/m2 Gen: NAD, alert, cooperative with exam HEENT: NCAT CV: RRR, good S1/S2, no murmur Resp: CTABL, no wheezes, non-labored Ext: No edema, warm Neuro: Alert and oriented, No gross deficits  Assessment and plan:  Essential hypertension, benign Controlled Taking prinzide, HCTZ, metop, and norvasc She did not stop HCTZ and is unsure of lisinopril/HCTZ Just filled HCTZ so wants to continue and start lisinopril alone Labs today   DM2 (diabetes mellitus, type 2) Improved, well controlled Foot exam normal today Waiting on note from ophtho Continue metformin Encouraged and congratulated on success    Cellulitis L facial cellulitis, early and mild NO systemic symptoms other than malaise Tx with keflex, avoid braod spectrum in elderly but gave strict return/callback precautions       Orders Placed This Encounter  Procedures  . Basic Metabolic Panel  . POCT HgB A1C     Meds ordered this encounter  Medications  . cephALEXin (KEFLEX) 500 MG capsule    Sig: Take 1 capsule (500 mg total) by mouth 3 (three) times daily.    Dispense:  21 capsule    Refill:  0

## 2015-03-25 ENCOUNTER — Telehealth: Payer: Self-pay | Admitting: Family Medicine

## 2015-03-25 DIAGNOSIS — I10 Essential (primary) hypertension: Secondary | ICD-10-CM

## 2015-03-25 NOTE — Telephone Encounter (Signed)
Called and discussed labs and HTN med rec. She is taking norvasc, HCTZ, and has lisinopril now. NO metoprolol, an she never started prinzide.   She reports elevated BPs at home 140s-150s, so I think adding lisinopril for HTN and renal protection in DM2 is very appropriate.   Repeat labs in 1 month, patient will call in to schedule lab appt.   Has Hx of atrial tachycardia which BB was started by cardiology for. She has been off of this for a time, and maybe never on it now that I see her confusion around medicines. Can consider decreasing/discontinuing other meds and adding toprol back if needed in future.   Murtis SinkSam Grier Vu, MD [facility] Resident, PGY-3 03/25/2015, 11:41 AM

## 2015-04-28 ENCOUNTER — Other Ambulatory Visit: Payer: Self-pay | Admitting: *Deleted

## 2015-04-28 MED ORDER — HYDROCHLOROTHIAZIDE 25 MG PO TABS: 25.0000 mg | ORAL_TABLET | Freq: Every day | ORAL | Status: DC

## 2015-04-28 MED ORDER — AMLODIPINE BESYLATE 10 MG PO TABS: 10.0000 mg | ORAL_TABLET | Freq: Every day | ORAL | Status: DC

## 2015-04-28 MED ORDER — LISINOPRIL 20 MG PO TABS: 20.0000 mg | ORAL_TABLET | Freq: Every day | ORAL | Status: DC

## 2015-04-28 MED ORDER — METOPROLOL SUCCINATE ER 50 MG PO TB24: 50.0000 mg | ORAL_TABLET | Freq: Every day | ORAL | Status: DC

## 2015-04-28 NOTE — Telephone Encounter (Signed)
Pt is requesting a 90 day supply.  Clovis PuMartin, Tamika L, RN

## 2015-05-02 IMAGING — MG MM DIGITAL DIAGNOSTIC UNILAT*R*
2 series · 2 of 2 positions shown · non-contrast
Comparison: 01/15/2014

CLINICAL DATA: Difficulty swallowing, right axillary fullness, sore
throat, medial right breast pain

EXAM:
DIGITAL DIAGNOSTIC  RIGHT MAMMOGRAM WITH CAD
ULTRASOUND RIGHT BREAST

[R CC]
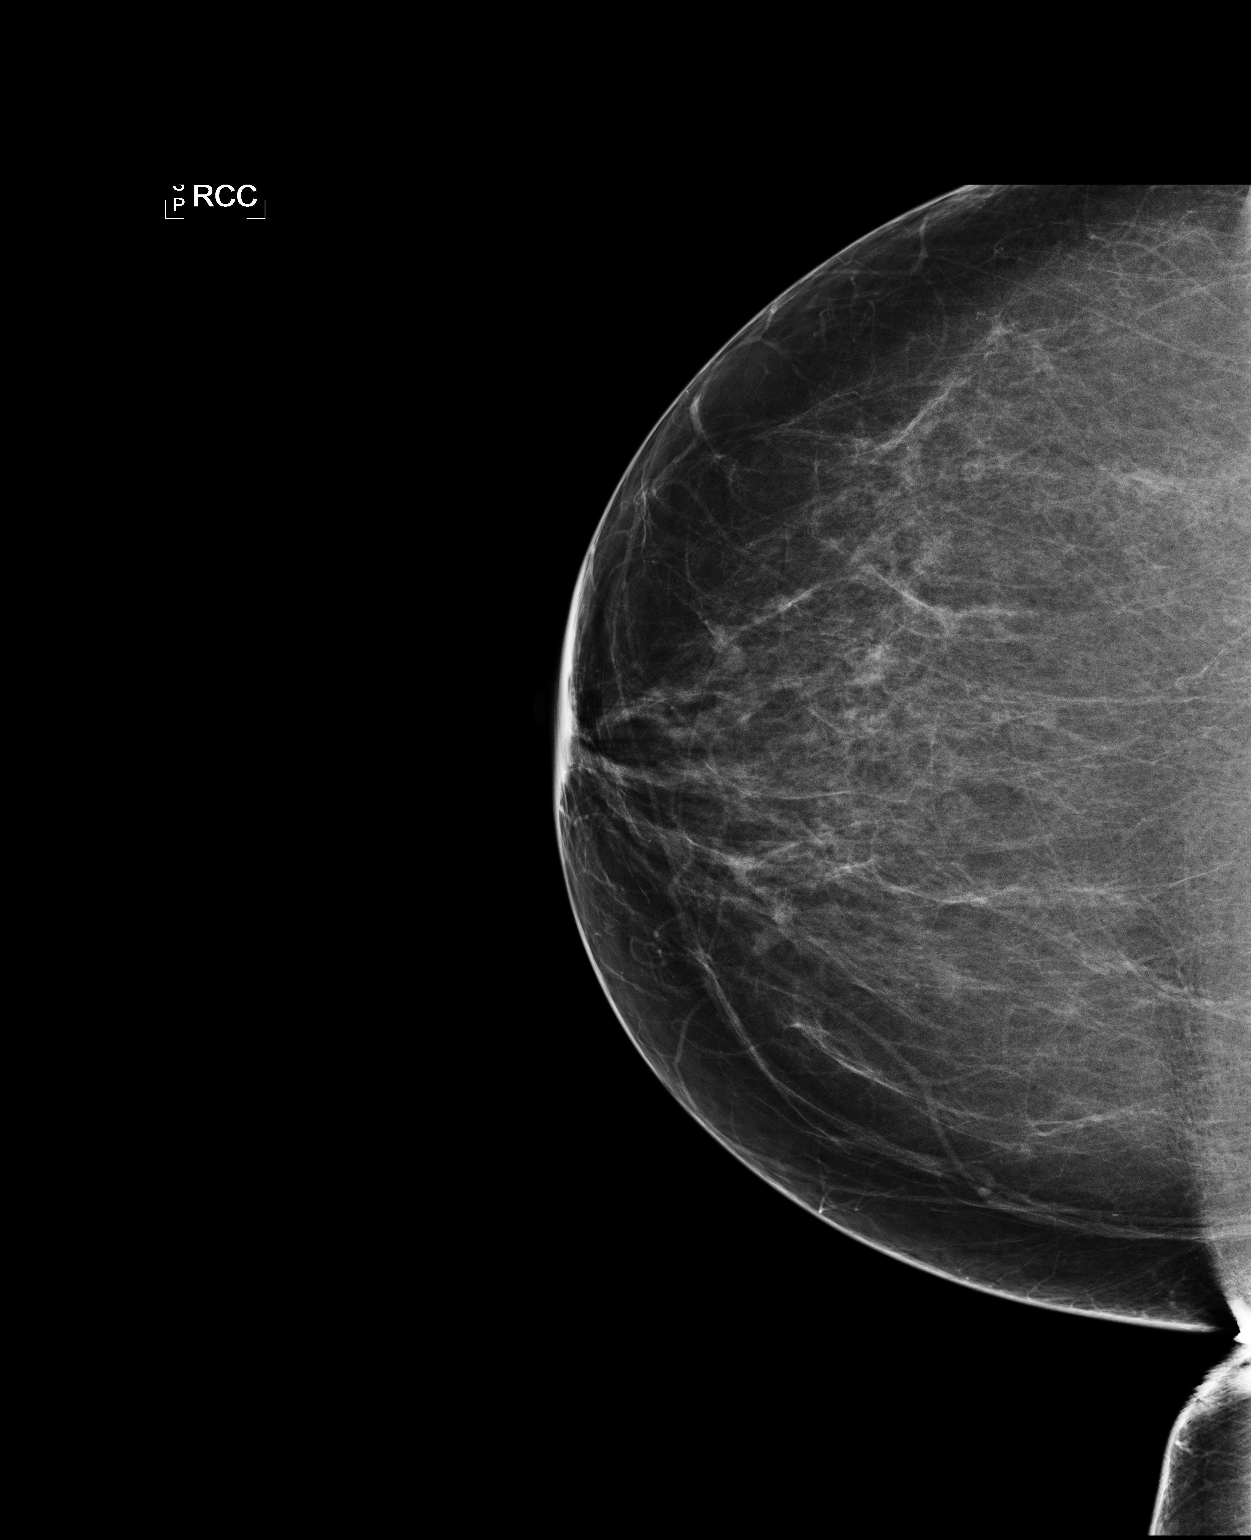

[R MLO]
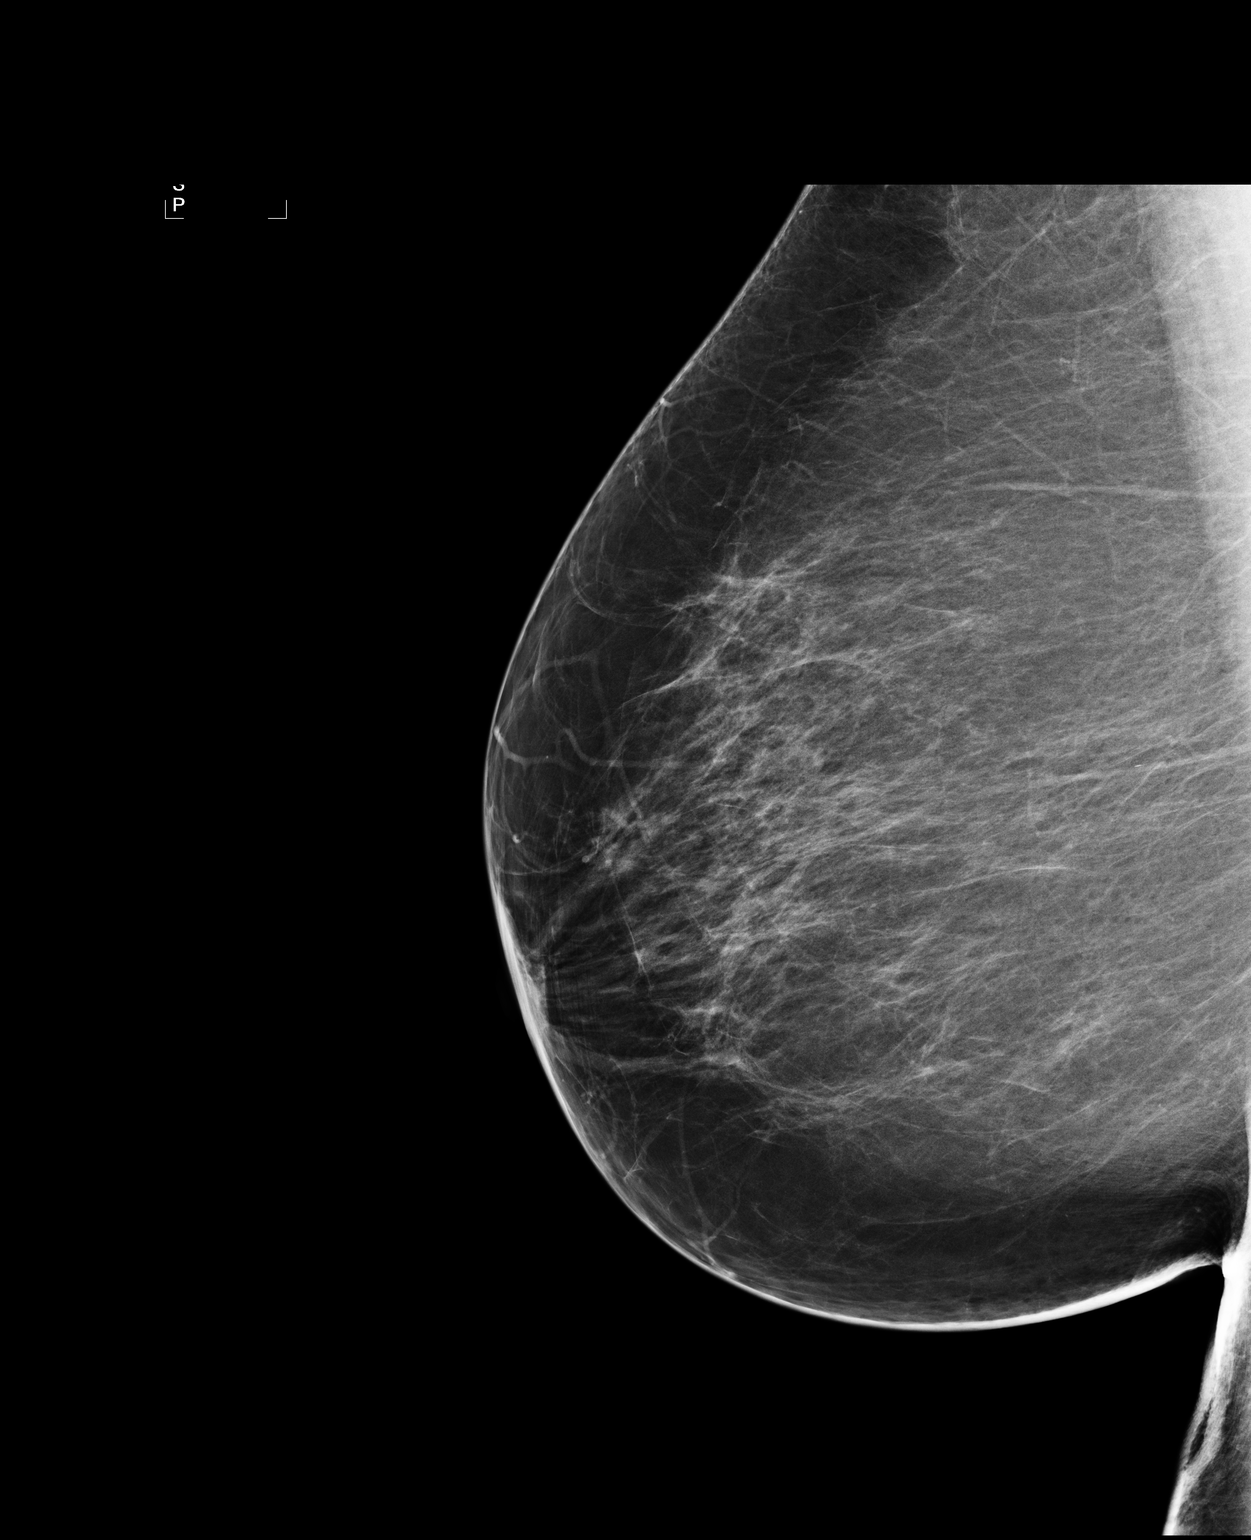

[2 of 2 positions shown; findings below may reference images not displayed]

ACR Breast Density Category b: There are scattered areas of
fibroglandular density.
FINDINGS: There are no mammographic abnormalities. There is no significant
change from prior studies.

Mammographic images were processed with CAD.

On physical exam, there are no palpable abnormalities in the right
axilla or in the medial right breast. The patient describes point
tenderness in the medial right breast region overlying costochondral
junctions.

Ultrasound is performed, showing no abnormalities in the medial
right breast. There are numerous normal right axillary lymph nodes
all of which demonstrate normal prominent fatty hila. The largest
measure 16 mm in maximal dimension.
IMPRESSION: 1. No abnormalities in the area of tenderness medial right breast.
The patient points to tenderness directly overlying costochondral
junction possibly indicative of arthritis.
2. Normal probably reactive right axillary lymph nodes
3. Patient was encouraged to discuss difficulty swallowing with her
referring physician

RECOMMENDATION:
Screening mammogram in 8 months

I have discussed the findings and recommendations with the patient.
Results were also provided in writing at the conclusion of the
visit. If applicable, a reminder letter will be sent to the patient
regarding the next appointment.

BI-RADS CATEGORY  2: Benign.

## 2015-05-21 ENCOUNTER — Encounter: Payer: Self-pay | Admitting: Cardiology

## 2015-05-21 ENCOUNTER — Ambulatory Visit (INDEPENDENT_AMBULATORY_CARE_PROVIDER_SITE_OTHER): Payer: Medicare Other | Admitting: Cardiology

## 2015-05-21 VITALS — BP 140/78 | HR 57 | Ht 68.0 in | Wt 187.1 lb

## 2015-05-21 DIAGNOSIS — R0789 Other chest pain: Secondary | ICD-10-CM | POA: Diagnosis not present

## 2015-05-21 DIAGNOSIS — I471 Supraventricular tachycardia: Secondary | ICD-10-CM

## 2015-05-21 DIAGNOSIS — I10 Essential (primary) hypertension: Secondary | ICD-10-CM

## 2015-05-21 NOTE — Progress Notes (Signed)
Cardiology Office Note   Date:  05/21/2015   ID:  Mackenna, Kamer 1941/11/20, MRN [id number]  PCP:  Kenn File, MD    Chief Complaint  Patient presents with  . Follow-up    chest pain, atypical      History of Present Illness: Wendy Velez is a 74 y.o. female with a history of dyslipidemia and HTN who presents today for followup..She underwent nuclear stress test for CP which showed no ischemia and 2D echo showed normal LVF with severe septal hypertrophy and chordal SAM but no LVOT resting gradient. She was started on Toprol. She has no family history of sudden cardiac death. Holter monitor showed NSR with PVC's and PAC's and nonsustained atrial tachycardia.  She denies any chest pain or SOB.   She denies any dizziness, LE edema, palpitations or syncope.     Past Medical History  Diagnosis Date  . Hypertension   . Hyperlipidemia   . HOCM (hypertrophic obstructive cardiomyopathy)     a. Echo (11/15):  Mod LVH, mod to severe focal basal hypertrophy of septum, EF 60-65%, Gr 1 DD, Ao sclerosis no stenosis, chordal SAM, mild MR, mild LAE, atrial septal aneurysm with sugg of small PFO (consider bubble study) >> b. ETT-Myoview 6/15 with normal BP response to exercise    Past Surgical History  Procedure Laterality Date  . Abdominal hysterectomy  1985    still has ovaries     Current Outpatient Prescriptions  Medication Sig Dispense Refill  . amLODipine (NORVASC) 10 MG tablet Take 1 tablet (10 mg total) by mouth daily. 90 tablet 3  . Blood Glucose Monitoring Suppl (ONE TOUCH ULTRA 2) W/DEVICE KIT 1 Device by Does not apply route as needed. 1 each 0  . glucose blood (ONE TOUCH ULTRA TEST) test strip 1 each by Other route every morning. Use as instructed 35 each 12  . hydrochlorothiazide (HYDRODIURIL) 25 MG tablet Take 1 tablet (25 mg total) by mouth daily. 90 tablet 3  . lisinopril (PRINIVIL,ZESTRIL) 20 MG tablet Take 1 tablet (20 mg total)  by mouth daily. 90 tablet 3  . metFORMIN (GLUCOPHAGE) 1000 MG tablet Take 1 tablet (1,000 mg total) by mouth 2 (two) times daily with a meal. 180 tablet 3  . metoprolol succinate (TOPROL-XL) 50 MG 24 hr tablet Take 1 tablet (50 mg total) by mouth daily. 90 tablet 3  . Nutritional Supplements (ESTROVEN PO) Take by mouth. Has black cohosh    . ONE TOUCH LANCETS MISC 1 each by Does not apply route every morning. 200 each 11  . pravastatin (PRAVACHOL) 40 MG tablet TAKE 1 TABLET (40 MG TOTAL) BY MOUTH DAILY. 90 tablet 3   No current facility-administered medications for this visit.    Allergies:   Review of patient's allergies indicates no known allergies.    Social History:  The patient  reports that she has never smoked. She has never used smokeless tobacco. She reports that she drinks about 4.2 oz of alcohol per week. She reports that she does not use illicit drugs.   Family History:  The patient's family history includes Alzheimer's disease in her mother, paternal aunt, and paternal uncle; Diabetes in her son. There is no history of Colon cancer.    ROS:  Please see the history of present illness.   Otherwise, review of systems are positive for none.   All other systems are  reviewed and negative.    PHYSICAL EXAM: VS:  BP 140/78 mmHg  Pulse 57  Ht $R'5\' 8"'UX$  (1.727 m)  Wt 187 lb 1.9 oz (84.877 kg)  BMI 28.46 kg/m2  SpO2 97% , BMI Body mass index is 28.46 kg/(m^2). GEN: Well nourished, well developed, in no acute distress HEENT: normal Neck: no JVD, carotid bruits, or masses Cardiac: RRR; no murmurs, rubs, or gallops,no edema  Respiratory:  clear to auscultation bilaterally, normal work of breathing GI: soft, nontender, nondistended, + BS MS: no deformity or atrophy Skin: warm and dry, no rash Neuro:  Strength and sensation are intact Psych: euthymic mood, full affect   EKG:  EKG is not ordered today.    Recent Labs: 01/21/2015: ALT 9 03/24/2015: BUN 17; Creat 0.84; Potassium  3.9; Sodium 136    Lipid Panel    Component Value Date/Time   CHOL 181 01/02/2014 1000   TRIG 83 01/02/2014 1000   HDL 45 01/02/2014 1000   CHOLHDL 4.0 01/02/2014 1000   VLDL 17 01/02/2014 1000   LDLCALC 119* 01/02/2014 1000      Wt Readings from Last 3 Encounters:  05/21/15 187 lb 1.9 oz (84.877 kg)  03/24/15 190 lb (86.183 kg)  01/21/15 198 lb 1.6 oz (89.858 kg)     ASSESSMENT AND PLAN:  1. Chest pain, atypical: No recurrence. Myoview low risk. No further workup needed. 2. Atrial tachycardia: No palpitation symptoms. She is tolerating low dose beta blocker. If she has symptoms of palpitations, would suggest increasing Toprol-XL as her BP and HR could tolerate. 3. 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 4. Essential hypertension, benign: Controlled. Continue amlodipine/HCTZ/Toprol/ACE I. 5. HLD (hyperlipidemia) : She remains on statin. 6. ? PFO on echo - will repeat limited echo with bubble study   Current medicines are reviewed at length with the patient today.  The patient does not have concerns regarding medicines.  The following changes have been made:  no change  Labs/ tests ordered today: See above Assessment and Plan No orders of the defined types were placed in this encounter.     Disposition:   FU with me in 1 year  Signed, Sueanne Margarita, MD  05/21/2015 8:38 AM    [facility] [place], [place], [place]  [address] Phone: [phone]; Fax: [phone]

## 2015-05-21 NOTE — Patient Instructions (Addendum)
Medication Instructions:  Your physician recommends that you continue on your current medications as directed. Please refer to the Current Medication list given to you today.   Labwork: None  Testing/Procedures: Your physician has requested that you have an echocardiogram with bubble study. Echocardiography is a painless test that uses sound waves to create images of your heart. It provides your doctor with information about the size and shape of your heart and how well your heart's chambers and valves are working. This procedure takes approximately one hour. There are no restrictions for this procedure.  Follow-Up: Your physician wants you to follow-up in: 1 year with Dr. Mayford Knife. You will receive a reminder letter in the mail two months in advance. If you don't receive a letter, please call our office to schedule the follow-up appointment.   Any Other Special Instructions Will Be Listed Below (If Applicable).

## 2015-06-02 ENCOUNTER — Ambulatory Visit (HOSPITAL_COMMUNITY): Payer: Medicare Other

## 2015-06-16 ENCOUNTER — Ambulatory Visit (HOSPITAL_COMMUNITY): Payer: Medicare Other | Attending: Cardiology

## 2015-06-16 DIAGNOSIS — R0789 Other chest pain: Secondary | ICD-10-CM | POA: Diagnosis not present

## 2015-06-16 DIAGNOSIS — Q211 Atrial septal defect: Secondary | ICD-10-CM | POA: Diagnosis not present

## 2015-06-16 DIAGNOSIS — I471 Supraventricular tachycardia: Secondary | ICD-10-CM | POA: Diagnosis not present

## 2015-07-08 ENCOUNTER — Ambulatory Visit (INDEPENDENT_AMBULATORY_CARE_PROVIDER_SITE_OTHER): Payer: Medicare Other | Admitting: Family Medicine

## 2015-07-08 ENCOUNTER — Encounter: Payer: Self-pay | Admitting: Family Medicine

## 2015-07-08 VITALS — BP 155/67 | HR 58 | Temp 97.5°F | Ht 68.0 in | Wt 185.9 lb

## 2015-07-08 DIAGNOSIS — E119 Type 2 diabetes mellitus without complications: Secondary | ICD-10-CM

## 2015-07-08 DIAGNOSIS — I10 Essential (primary) hypertension: Secondary | ICD-10-CM | POA: Diagnosis not present

## 2015-07-08 DIAGNOSIS — E785 Hyperlipidemia, unspecified: Secondary | ICD-10-CM

## 2015-07-08 DIAGNOSIS — Z23 Encounter for immunization: Secondary | ICD-10-CM

## 2015-07-08 DIAGNOSIS — N182 Chronic kidney disease, stage 2 (mild): Secondary | ICD-10-CM | POA: Insufficient documentation

## 2015-07-08 LAB — BASIC METABOLIC PANEL WITH GFR
BUN: 23 mg/dL (ref 7–25)
CHLORIDE: 106 mmol/L (ref 98–110)
CO2: 25 mmol/L (ref 20–31)
Calcium: 9.5 mg/dL (ref 8.6–10.4)
Creat: 0.91 mg/dL (ref 0.60–0.93)
GFR, Est African American: 71 mL/min (ref >=60)
GFR, Est Non African American: 62 mL/min (ref >=60)
GLUCOSE: 107 mg/dL — AB (ref 65–99)
Potassium: 3.8 mmol/L (ref 3.5–5.3)
Sodium: 143 mmol/L (ref 135–146)

## 2015-07-08 LAB — LIPID PANEL
CHOL/HDL RATIO: 3.1 ratio (ref <=5.0)
CHOLESTEROL: 127 mg/dL (ref 125–200)
HDL: 41 mg/dL — AB (ref >=46)
LDL Cholesterol: 73 mg/dL (ref <130)
Triglycerides: 65 mg/dL (ref <150)
VLDL: 13 mg/dL (ref <30)

## 2015-07-08 LAB — POCT GLYCOSYLATED HEMOGLOBIN (HGB A1C): Hemoglobin A1C: 6.5

## 2015-07-08 MED ORDER — LISINOPRIL-HYDROCHLOROTHIAZIDE 20-25 MG PO TABS: 1.0000 | ORAL_TABLET | Freq: Every day | ORAL | Status: DC

## 2015-07-08 MED ORDER — PRAVASTATIN SODIUM 40 MG PO TABS: ORAL_TABLET | ORAL | Status: DC

## 2015-07-08 MED ORDER — ASPIRIN EC 81 MG PO TBEC: 81.0000 mg | DELAYED_RELEASE_TABLET | Freq: Every day | ORAL | Status: AC

## 2015-07-08 NOTE — Patient Instructions (Signed)
Dear Wendy Velez, Thank you for coming in to clinic today. It was good to meet you and your husband today.  1. Congratulations you are doing very well. Your Diabetes is under control, improved A1c 6.8 to 6.5 2. Keep up the good work with diet, reduced sodas, plenty of water, increase fresh vegetables, reduce carbs / bread - try to stay active, in the future may need to do some walking / gentle exercise 3. Blood pressure is controlled - control all meds - Changed meds to 90 day supply, also changed Lisinopril and Hydrochlorothiazide to a SINGLE pill, take once daily  Some important numbers from today's visit: HgA1C - 6.8 BP - 130/78 re-check BP Results -  Labs today, I will notify you by phone or mail within a week.  Please schedule a follow-up appointment with Dr. Althea Charon in 3 to 6 months for Diabetes and BP.  If you have any other questions or concerns, please feel free to call the clinic to contact me. You may also schedule an earlier appointment if necessary.  However, if your symptoms get significantly worse, please go to the Emergency Department to seek immediate medical attention.  Wendy Pilar, DO [facility]

## 2015-07-08 NOTE — Assessment & Plan Note (Addendum)
Well-controlled. Current HgbA1c 6.5 (8/3), last HgbA1c 6.8 (03/2015). No complications or hypoglycemia. GFR 72 considered CKD Stage II - DM Foot exam 8/3 normal - Last ophtho 01/2015 Nile Riggs)  Plan:  1. Continue current therapy DM diet, Metformin - no insulin or change today 2. Lifestyle Mods - Improved DM diet (dec carbs, inc vegs), discussed may need to start exercise in future with walking if elevated sugars to avoid further meds, receptive to this 3. Check lipids, BMET follow-up K and SCr (results normal with stable SCr 0.8 to 0.9 and otherwise unremarkable) 4. Advised start OTC  daily ASA for ASCVD prevention 5. RTC 6 months for follow-up DM

## 2015-07-08 NOTE — Progress Notes (Signed)
   Subjective:    Patient ID: Wendy Velez, female    DOB: November 13, 1941, 74 y.o.   MRN: [id number]  HPI  CHRONIC DM, Type 2: Reports - feeling better, confident sugar is lower CBGs: Avg 120s, Low 100, High 130s (by memory, no meter or log). Checks CBGs x 1 daily fasting AM Meds: Metformin  BID, no insulin Reports good compliance. Tolerating well w/o side-effects Currently on ACEi Lifestyle: Diet (reducing carb diet, significantly reduced soda intake) / Exercise (no regular exercise) - has not taken ASA  daily - Saw Ophthalmology, Wendy Velez with normal eye exam, no DM retinopathy (approx 01/2015) Denies hypoglycemia, polyuria, visual changes, numbness or tingling.  CHRONIC HTN: Reports - doing well, not checking BP at home, no symptoms Current Meds - Amlodipine , HCTZ , Lisinopril , Metoprolol-XL  daily  Reports good compliance, took meds today. Tolerating well, w/o complaints. Lifestyle - low sodium diet (no salt added), no regular exercise Denies CP, dyspnea, HA, edema, dizziness / lightheadedness  I have reviewed and updated the following as appropriate: allergies and current medications  Social Hx: Never smoker. No alcohol.  Review of Systems  See above HPI    Objective:   Physical Exam  BP 155/67 mmHg  Pulse 58  Temp(Src) 97.5 F (36.4 C) (Oral)  Ht  (1.727 m)  Wt 185 lb 14.4 oz (84.324 kg)  BMI 28.27 kg/m2  Gen - well-appearing, comfortable, pleasant, NAD HEENT - PERRL, oropharynx clear, MMM Neck - supple, non-tender, no LAD, no thyromegaly Heart - RRR, no murmurs heard Lungs - CTAB, no wheezing, crackles, or rhonchi. Normal work of breathing. Ext - non-tender, no edema, peripheral pulses intact +2 b/l Skin - warm, dry, no rashes  DM FOOT EXAM - normal appearance, no lesions, calluses, or ulcers, intact sensation to monofilament bilaterally     Assessment & Plan:   See specific A&P problem list for details.

## 2015-07-08 NOTE — Assessment & Plan Note (Addendum)
On moderate intensity statin therapy with Pravastatin  daily, tolerating well. Indication with DM2 - Check fasting lipid panel - Reviewed results with improved LDL 119 to 73, otherwise stable and improved lipids

## 2015-07-08 NOTE — Assessment & Plan Note (Addendum)
Slightly elevated HTN. Near goal < 150/90 Stage II CKD   Plan:  1. Continue current BP meds 2. Check BMET, follow serum Cr (last 0.8 up to 0.9 now with CKD-II likely unchanged) 3. Lifestyle Mods - Dec salt intake, inc K+ rich vegs. May start gentle walking exercise 4. Monitor BP at home or at drug store occasionally.

## 2015-07-17 ENCOUNTER — Encounter: Payer: Self-pay | Admitting: Family Medicine

## 2015-10-24 ENCOUNTER — Other Ambulatory Visit: Payer: Self-pay | Admitting: Physician Assistant

## 2015-10-27 ENCOUNTER — Telehealth: Payer: Self-pay | Admitting: Physician Assistant

## 2015-10-27 NOTE — Telephone Encounter (Signed)
NEW MESSAGE   *STAT* If patient is at the pharmacy, call can be transferred to refill team.   1. Which medications need to be refilled? (please list name of each medication and dose if known) METOPROLOL SUCC ER 50MG   2. Which pharmacy/location (including street and city if local pharmacy) is medication to be sent to? [facility] [address]  3. Do they need a 30 day or 90 day supply? 90 DAY

## 2015-10-27 NOTE — Telephone Encounter (Signed)
Called pt and informed the pt that she has refills left at her pharmacy. I called the pharmacy and they stated that they would get the pt's refill ready for pick up. I explained this to the pt. I advised the pt that if she has any other problems, questions or concerns to call the office. Pt verbalized understanding.

## 2016-01-26 ENCOUNTER — Telehealth: Payer: Self-pay | Admitting: *Deleted

## 2016-01-26 NOTE — Telephone Encounter (Signed)
Called patient to offer flu vaccine. Patient stated she received it at [facility] on [address].Does not remember date. [facility] verified flu vaccine was given 09/04/2015. Added to historical immunization. Fredderick Severance, RN

## 2016-02-09 LAB — HM DIABETES EYE EXAM

## 2016-02-18 ENCOUNTER — Other Ambulatory Visit: Payer: Self-pay | Admitting: Family Medicine

## 2016-02-18 DIAGNOSIS — I10 Essential (primary) hypertension: Secondary | ICD-10-CM

## 2016-02-18 DIAGNOSIS — E119 Type 2 diabetes mellitus without complications: Secondary | ICD-10-CM

## 2016-02-18 MED ORDER — METFORMIN HCL 1000 MG PO TABS: 1000.0000 mg | ORAL_TABLET | Freq: Two times a day (BID) | ORAL | Status: DC

## 2016-02-18 NOTE — Telephone Encounter (Signed)
Pt needs refill on metformin.  She uses [facility] [address]

## 2016-03-22 ENCOUNTER — Encounter: Payer: Self-pay | Admitting: Family Medicine

## 2016-05-17 ENCOUNTER — Other Ambulatory Visit: Payer: Self-pay | Admitting: Family Medicine

## 2016-05-23 ENCOUNTER — Ambulatory Visit (INDEPENDENT_AMBULATORY_CARE_PROVIDER_SITE_OTHER): Payer: Medicare Other | Admitting: Family Medicine

## 2016-05-23 ENCOUNTER — Encounter: Payer: Self-pay | Admitting: Family Medicine

## 2016-05-23 VITALS — BP 160/75 | HR 57 | Temp 97.9°F | Ht 68.0 in | Wt 186.4 lb

## 2016-05-23 DIAGNOSIS — I10 Essential (primary) hypertension: Secondary | ICD-10-CM

## 2016-05-23 DIAGNOSIS — E119 Type 2 diabetes mellitus without complications: Secondary | ICD-10-CM

## 2016-05-23 DIAGNOSIS — E785 Hyperlipidemia, unspecified: Secondary | ICD-10-CM

## 2016-05-23 LAB — POCT GLYCOSYLATED HEMOGLOBIN (HGB A1C): HEMOGLOBIN A1C: 6.3

## 2016-05-23 MED ORDER — METFORMIN HCL 1000 MG PO TABS: 1000.0000 mg | ORAL_TABLET | Freq: Two times a day (BID) | ORAL | Status: DC

## 2016-05-23 MED ORDER — AMLODIPINE BESYLATE 10 MG PO TABS: 10.0000 mg | ORAL_TABLET | Freq: Every day | ORAL | Status: DC

## 2016-05-23 NOTE — Assessment & Plan Note (Signed)
Slightly elevated HTN. Near goal < 150/90. Likely due to off Amlodipine Stage II CKD   Plan:  1. Continue current BP meds - refilled Amlodipine today 2. Future BMET after 07/2016, follow serum Cr 3. Lifestyle Mods - start walking exercise 4. Monitor BP at home or at drug store occasionally, follow-up sooner if persistently elevated 5. RTC 3 months

## 2016-05-23 NOTE — Patient Instructions (Addendum)
Dear Wendy Velez, Thank you for coming in to clinic today. It was good to meet you and your husband today.  1. Congratulations you are doing very well. Your Diabetes is under control, improved A1c 6.5 to 6.3 2. Keep up the good work with diet, reduced sodas, plenty of water, increase fresh vegetables, reduce carbs / bread - try to stay active, in the future may need to do some walking this will help your BP too 3. Blood pressure is slightly elevated - Restart Amlodipine  Med refills sent to pharmacy  Please schedule a follow-up appointment with New Doctor in 3 months for Annual Physical / Basic Labwork / Blood Pressure  If you have any other questions or concerns, please feel free to call the clinic to contact me. You may also schedule an earlier appointment if necessary.  However, if your symptoms get significantly worse, please go to the Emergency Department to seek immediate medical attention.  Saralyn PilarAlexander Karamalegos, DO [facility]

## 2016-05-23 NOTE — Assessment & Plan Note (Signed)
Stable, continue pravastatin, moderate intensity Future check fasting lipid panel vs direct LDL after 07/2016

## 2016-05-23 NOTE — Assessment & Plan Note (Signed)
Well-controlled, improved A1c 6.5 to 6.3 today No complications or hypoglycemia.  Plan:  1. Continue current therapy DM diet, Metformin (refilled) 2. Lifestyle Mods - continue improved DM diet, start regular exercise 3. Future after 07/2016 check BMET, Lipids on statin 4. Continue ASA 81 5. Normal DM foot exam today, last DM ophtho 02/2016 6. RTC 6 months for follow-up DM

## 2016-05-23 NOTE — Progress Notes (Signed)
Subjective:    Patient ID: Wendy Velez, female    DOB: [birth year], 75 y.o.   MRN: [id number]  HPI  CHRONIC DM, Type 2: Reports doing well. Has new glucometer. CBGs: Avg 120s, Low 100, High 130s. Checks CBGs x 1 daily fasting AM Meds: Metformin  BID, no insulin Reports good compliance. Tolerating well w/o side-effects Currently on ACEi Lifestyle: Diet (reducing carb diet, only soda x 1 monthly) / Exercise (no regular exercise, but not walking regularly) - Taking ASA  daily - Saw Ophthalmology, Dr. Nile Riggs with normal eye exam, no DM retinopathy in 02/2016 Denies hypoglycemia, polyuria, visual changes, numbness or tingling.  CHRONIC HTN: Reports - she ran out of amlodipine for several days, and was told could not refill until came to doctor. Today admitted to have mild headache, since resolved, without any other associated symptoms. Occasionally checks BP at home does not recall numbers. Current Meds - Amlodipine , HCTZ , Lisinopril , Metoprolol-XL  daily  Reports good compliance, took meds today. Tolerating well, w/o complaints. Lifestyle - low sodium diet (no salt added), no regular exercise Denies CP, dyspnea, HA, edema, dizziness / lightheadedness  HLD / OBESITY Currently taking Pravastatin  daily. Tolerating well without myalgias or other complaints. Weight currently stable 186 lb from 187 lb 1 year ago No regular exercise, see above  Review of Systems  See above HPI    Objective:   Physical Exam  Constitutional: She is oriented to person, place, and time. She appears well-developed and well-nourished. No distress.  Well-appearing, comfortable, cooperative  HENT:  Head: Normocephalic and atraumatic.  Mouth/Throat: Oropharynx is clear and moist.  Neck: Normal range of motion. Neck supple. No thyromegaly present.  Cardiovascular: Normal rate, regular rhythm, normal heart sounds and intact distal pulses.   No murmur  heard. Pulmonary/Chest: Effort normal and breath sounds normal. No respiratory distress. She has no wheezes. She has no rales.  Musculoskeletal: She exhibits no edema.  Neurological: She is alert and oriented to person, place, and time.  Skin: Skin is warm and dry. She is not diaphoretic.  Nursing note and vitals reviewed.  DM FOOT EXAM - normal appearance, no lesions, calluses, or ulcers, intact sensation to monofilament bilaterally  BP 160/75 mmHg  Pulse 57  Temp(Src) 97.9 F (36.6 C) (Oral)  Ht  (1.727 m)  Wt 186 lb 6.4 oz (84.55 kg)  BMI 28.35 kg/m2  Filed Weights   05/23/16 1450  Weight: 186 lb 6.4 oz (84.55 kg)        Assessment & Plan:   Problem List Items Addressed This Visit    HLD (hyperlipidemia)    Stable, continue pravastatin, moderate intensity Future check fasting lipid panel vs direct LDL after 07/2016      Relevant Medications   amLODipine (NORVASC) 10 MG tablet   Essential hypertension, benign    Slightly elevated HTN. Near goal < 150/90. Likely due to off Amlodipine Stage II CKD   Plan:  1. Continue current BP meds - refilled Amlodipine today 2. Future BMET after 07/2016, follow serum Cr 3. Lifestyle Mods - start walking exercise 4. Monitor BP at home or at drug store occasionally, follow-up sooner if persistently elevated 5. RTC 3 months      Relevant Medications   amLODipine (NORVASC) 10 MG tablet   DM2 (diabetes mellitus, type 2) (HCC) - Primary    Well-controlled, improved A1c 6.5 to 6.3 today No complications or hypoglycemia.  Plan:  1. Continue current therapy DM diet,  Metformin (refilled) 2. Lifestyle Mods - continue improved DM diet, start regular exercise 3. Future after 07/2016 check BMET, Lipids on statin 4. Continue ASA 81 5. Normal DM foot exam today, last DM ophtho 02/2016 6. RTC 6 months for follow-up DM      Relevant Medications   metFORMIN (GLUCOPHAGE) 1000 MG tablet   Other Relevant Orders   HgB A1c (Completed)       Return in about 3 months (around 08/23/2016) for meet new doctor, physical / annual labs / BP.  Saralyn PilarAlexander Kabrina Christiano, DO [facility], PGY-3

## 2016-06-23 ENCOUNTER — Encounter: Payer: Self-pay | Admitting: Cardiology

## 2016-07-13 ENCOUNTER — Other Ambulatory Visit: Payer: Self-pay | Admitting: Family Medicine

## 2016-07-13 ENCOUNTER — Other Ambulatory Visit: Payer: Self-pay | Admitting: *Deleted

## 2016-07-13 DIAGNOSIS — I10 Essential (primary) hypertension: Secondary | ICD-10-CM

## 2016-07-13 DIAGNOSIS — E785 Hyperlipidemia, unspecified: Secondary | ICD-10-CM

## 2016-07-15 MED ORDER — PRAVASTATIN SODIUM 40 MG PO TABS: ORAL_TABLET | ORAL | 3 refills | Status: DC

## 2016-07-15 MED ORDER — LISINOPRIL-HYDROCHLOROTHIAZIDE 20-25 MG PO TABS: 1.0000 | ORAL_TABLET | Freq: Every day | ORAL | 3 refills | Status: DC

## 2016-07-18 ENCOUNTER — Other Ambulatory Visit: Payer: Self-pay

## 2016-07-18 MED ORDER — METOPROLOL SUCCINATE ER 50 MG PO TB24: 50.0000 mg | ORAL_TABLET | Freq: Every day | ORAL | 0 refills | Status: DC

## 2016-08-05 ENCOUNTER — Ambulatory Visit: Payer: Medicare Other | Admitting: Cardiology

## 2016-08-09 ENCOUNTER — Encounter: Payer: Self-pay | Admitting: Cardiology

## 2016-08-23 DIAGNOSIS — Z23 Encounter for immunization: Secondary | ICD-10-CM | POA: Diagnosis not present

## 2016-08-24 ENCOUNTER — Encounter: Payer: Self-pay | Admitting: Cardiology

## 2016-08-24 ENCOUNTER — Ambulatory Visit (INDEPENDENT_AMBULATORY_CARE_PROVIDER_SITE_OTHER): Payer: Medicare Other | Admitting: Cardiology

## 2016-08-24 ENCOUNTER — Encounter (INDEPENDENT_AMBULATORY_CARE_PROVIDER_SITE_OTHER): Payer: Self-pay

## 2016-08-24 VITALS — BP 140/70 | HR 68 | Ht 68.0 in | Wt 185.4 lb

## 2016-08-24 DIAGNOSIS — I421 Obstructive hypertrophic cardiomyopathy: Secondary | ICD-10-CM

## 2016-08-24 DIAGNOSIS — I471 Supraventricular tachycardia: Secondary | ICD-10-CM | POA: Diagnosis not present

## 2016-08-24 DIAGNOSIS — I10 Essential (primary) hypertension: Secondary | ICD-10-CM | POA: Diagnosis not present

## 2016-08-24 NOTE — Progress Notes (Signed)
Cardiology Office Note    Date:  08/24/2016   ID:  Wendy, Velez 1941/06/07, MRN [id number]  PCP:  Adin Hector, MD  Cardiologist:  Fransico Him, MD   Chief Complaint  Patient presents with  . Follow-up    CM/HTN/atrial tachycardia    History of Present Illness:  Wendy Velez is a 75 y.o. female with a history of dyslipidemia and HTN who presents today for followup..She underwent nuclear stress test for CP which showed no ischemia and 2D echo showed normal LVF with severe septal hypertrophy and chordal SAM but no LVOT resting gradient. She was started on Toprol. She has no family history of sudden cardiac death. Holter monitor showed NSR with PVC's and PAC's and nonsustained atrial tachycardia.  She denies any chest pain or SOB.   She denies any dizziness, LE edema, palpitations or syncope.     Past Medical History:  Diagnosis Date  . HOCM (hypertrophic obstructive cardiomyopathy) ([place])    a. Echo (11/15):  Mod LVH, mod to severe focal basal hypertrophy of septum, EF 60-65%, Gr 1 DD, Ao sclerosis no stenosis, chordal SAM, mild MR, mild LAE, atrial septal aneurysm with sugg of small PFO (consider bubble study) >> b. ETT-Myoview 6/15 with normal BP response to exercise  . Hyperlipidemia   . Hypertension     Past Surgical History:  Procedure Laterality Date  . ABDOMINAL HYSTERECTOMY  1985   still has ovaries    Current Medications: Outpatient Medications Prior to Visit  Medication Sig Dispense Refill  . amLODipine (NORVASC) 10 MG tablet Take 1 tablet (10 mg total) by mouth daily. 90 tablet 1  . aspirin EC 81 MG tablet Take 1 tablet (81 mg total) by mouth daily.    . Blood Glucose Monitoring Suppl (ONE TOUCH ULTRA 2) W/DEVICE KIT 1 Device by Does not apply route as needed. 1 each 0  . glucose blood (ONE TOUCH ULTRA TEST) test strip 1 each by Other route every morning. Use as instructed 35 each 12  . lisinopril-hydrochlorothiazide  (PRINZIDE,ZESTORETIC) 20-25 MG tablet Take 1 tablet by mouth daily. 90 tablet 3  . metFORMIN (GLUCOPHAGE) 1000 MG tablet Take 1 tablet (1,000 mg total) by mouth 2 (two) times daily with a meal. 180 tablet 1  . metoprolol succinate (TOPROL-XL) 50 MG 24 hr tablet Take 1 tablet (50 mg total) by mouth daily. 90 tablet 0  . ONE TOUCH LANCETS MISC 1 each by Does not apply route every morning. 200 each 11  . pravastatin (PRAVACHOL) 40 MG tablet TAKE 1 TABLET (40 MG TOTAL) BY MOUTH DAILY. 90 tablet 3  . Nutritional Supplements (ESTROVEN PO) Take by mouth. Has black cohosh     No facility-administered medications prior to visit.      Allergies:   Review of patient's allergies indicates no known allergies.   Social History   Social History  . Marital status: Married    Spouse name: N/A  . Number of children: N/A  . Years of education: N/A   Social History Main Topics  . Smoking status: Never Smoker  . Smokeless tobacco: Never Used  . Alcohol use 4.2 oz/week    7 Glasses of wine per week  . Drug use: No  . Sexual activity: Not Asked   Other Topics Concern  . None   Social History Narrative  . None     Family History:  The patient's family history includes Alzheimer's disease in her mother, paternal aunt, and paternal  uncle; Diabetes in her son.   ROS:   Please see the history of present illness.    ROS All other systems reviewed and are negative.  No flowsheet data found.     PHYSICAL EXAM:   VS:  BP 140/70   Pulse 68   Ht 5' 8" (1.727 m)   Wt 185 lb 6.4 oz (84.1 kg)   BMI 28.19 kg/m    GEN: Well nourished, well developed, in no acute distress  HEENT: normal  Neck: no JVD, carotid bruits, or masses Cardiac: RRR; no murmurs, rubs, or gallops,no edema.  Intact distal pulses bilaterally.  Respiratory:  clear to auscultation bilaterally, normal work of breathing GI: soft, nontender, nondistended, + BS MS: no deformity or atrophy  Skin: warm and dry, no rash Neuro:  Alert  and Oriented x 3, Strength and sensation are intact Psych: euthymic mood, full affect  Wt Readings from Last 3 Encounters:  08/24/16 185 lb 6.4 oz (84.1 kg)  05/23/16 186 lb 6.4 oz (84.6 kg)  07/08/15 185 lb 14.4 oz (84.3 kg)      Studies/Labs Reviewed:   EKG:  EKG is ordered today.  The ekg ordered today demonstrates NSR with PVCs   Recent Labs: No results found for requested labs within last 8760 hours.   Lipid Panel    Component Value Date/Time   CHOL 127 07/08/2015 0930   TRIG 65 07/08/2015 0930   HDL 41 (L) 07/08/2015 0930   CHOLHDL 3.1 07/08/2015 0930   VLDL 13 07/08/2015 0930   LDLCALC 73 07/08/2015 0930    Additional studies/ records that were reviewed today include:  none    ASSESSMENT:    1. HOCM (hypertrophic obstructive cardiomyopathy) ([facility])   2. Essential hypertension, benign   3. Atrial tachycardia ([facility])      PLAN:  In order of problems listed above:  1. HTN - BP controlled on current meds.  Continue amlodipine/ACE/diuretic and BB.  Check BMET.  2. HOCM - she is asymptomatic.  Echo a year ago showed septal wall thickness of 12mm with chordal SAM and no significant gradient.  Will repeat echo in a year.  She has no family history of SCD and she has not had any syncope.  I have recommended that her children have a screening echo since this can be hereditary. Continue BB.   3. Atrial tachycardia with no reoccurence. Continue BB.     Medication Adjustments/Labs and Tests Ordered: Current medicines are reviewed at length with the patient today.  Concerns regarding medicines are outlined above.  Medication changes, Labs and Tests ordered today are listed in the Patient Instructions below.  There are no Patient Instructions on file for this visit.   Signed, Traci Turner, MD  08/24/2016 2:43 PM     [facility] [address], [place], [place]  [address] Phone: [phone]; Fax: [phone]   

## 2016-08-24 NOTE — Patient Instructions (Signed)
Your physician recommends that you continue on your current medications as directed. Please refer to the Current Medication list given to you today.  Your physician recommends that you return for lab work in:   TODAY  BMET  Your physician wants you to follow-up UJ:WJXBin:YEAR WITH DR  Mayford KnifeURNER  You will receive a reminder letter in the mail two months in advance. If you don't receive a letter, please call our office to schedule the follow-up appointment.

## 2016-08-25 LAB — BASIC METABOLIC PANEL
BUN: 24 mg/dL (ref 7–25)
CHLORIDE: 105 mmol/L (ref 98–110)
CO2: 24 mmol/L (ref 20–31)
CREATININE: 0.91 mg/dL (ref 0.60–0.93)
Calcium: 9.4 mg/dL (ref 8.6–10.4)
Glucose, Bld: 98 mg/dL (ref 65–99)
POTASSIUM: 4 mmol/L (ref 3.5–5.3)
Sodium: 142 mmol/L (ref 135–146)

## 2016-08-29 ENCOUNTER — Encounter: Payer: Self-pay | Admitting: Internal Medicine

## 2016-09-21 ENCOUNTER — Encounter: Payer: Self-pay | Admitting: Internal Medicine

## 2016-09-21 ENCOUNTER — Ambulatory Visit (INDEPENDENT_AMBULATORY_CARE_PROVIDER_SITE_OTHER): Payer: Medicare Other | Admitting: Internal Medicine

## 2016-09-21 VITALS — BP 134/71 | HR 62 | Temp 98.1°F | Ht 68.0 in | Wt 183.0 lb

## 2016-09-21 DIAGNOSIS — E119 Type 2 diabetes mellitus without complications: Secondary | ICD-10-CM

## 2016-09-21 DIAGNOSIS — E785 Hyperlipidemia, unspecified: Secondary | ICD-10-CM | POA: Diagnosis not present

## 2016-09-21 DIAGNOSIS — I10 Essential (primary) hypertension: Secondary | ICD-10-CM | POA: Diagnosis not present

## 2016-09-21 LAB — POCT GLYCOSYLATED HEMOGLOBIN (HGB A1C): Hemoglobin A1C: 6

## 2016-09-21 MED ORDER — AMLODIPINE BESYLATE 10 MG PO TABS: 10.0000 mg | ORAL_TABLET | Freq: Every day | ORAL | 1 refills | Status: DC

## 2016-09-21 MED ORDER — LISINOPRIL-HYDROCHLOROTHIAZIDE 20-25 MG PO TABS: 1.0000 | ORAL_TABLET | Freq: Every day | ORAL | 3 refills | Status: DC

## 2016-09-21 NOTE — Assessment & Plan Note (Signed)
Well-controlled. A1C today decreased further to 6 (6.3 at last appt). No reported sx of neuropathy and no abnormalities on foot exam. Follows with ophthalmologist regularly, with no recent vision changes.  - Continue metformin 1000mg  qd

## 2016-09-21 NOTE — Assessment & Plan Note (Signed)
Well-controlled. 134/71 in office today, and reports typically around this when measured at home or at drugstore.  - Continue lisinopril-HCTZ, metoprolol, amlodipine

## 2016-09-21 NOTE — Patient Instructions (Signed)
It was nice meeting you today [name]!  Your A1C (average of your blood sugar over the past three months) improved today from your last visit, showing us that your diabetes is very well-controlled. Please continue to take metformin 1000mg  a day as you have been.   For your blood pressure, please continue to take your medications as you have been as well.   If you have any questions or concerns, please feel free to call the clinic.   Be well,  Dr. Natale MilchLancaster

## 2016-09-21 NOTE — Assessment & Plan Note (Signed)
Continue pravastatin 

## 2016-09-21 NOTE — Progress Notes (Signed)
   Subjective:    Patient ID: Wendy Velez, female    DOB: 01/12/1941, 75 y.o.   MRN: [id number]  HPI  Patient presents for DM, HTN, and HLD f/u.   Type II DM Currently taking metformin 1000mg  qd. No GI upset or other complaints. Checks blood sugar about once a week in the AM before breakfast. CBG usually around 100. Has been seen by an ophthalmologist already this year, and has next annual appt in March. Denies numbness, tingling, or burning of hands or feet.   HTN Currently taking amlodipine, lisinopril-HCTZ, and metoprolol. Denies any side effects of medications. Checks blood pressure at home and at drugstores at least once a week. Reports typical readings between 130-140/70-80. No recent recurrent headaches or changes in vision.   HLD Taking pravastatin daily. Compliant. No side effects of medication.   Review of Systems     Objective:   Physical Exam  Constitutional: She is oriented to person, place, and time. She appears well-developed and well-nourished. No distress.  HENT:  Head: Normocephalic and atraumatic.  Eyes: Conjunctivae and EOM are normal. Pupils are equal, round, and reactive to light. Right eye exhibits no discharge. Left eye exhibits no discharge. No scleral icterus.  Pulmonary/Chest: Effort normal. No respiratory distress.  Neurological: She is alert and oriented to person, place, and time.  Skin: Skin is warm and dry.  Psychiatric: She has a normal mood and affect. Her behavior is normal.   Diabetic Foot Exam - Simple   Simple Foot Form Diabetic Foot exam was performed with the following findings:  Yes 09/21/2016 12:04 PM  Visual Inspection No deformities, no ulcerations, no other skin breakdown bilaterally:  Yes Sensation Testing Intact to touch and monofilament testing bilaterally:  Yes Pulse Check Posterior Tibialis and Dorsalis pulse intact bilaterally:  Yes Comments       Assessment & Plan:  DM2 (diabetes mellitus, type 2) Well-controlled.  A1C today decreased further to 6 (6.3 at last appt). No reported sx of neuropathy and no abnormalities on foot exam. Follows with ophthalmologist regularly, with no recent vision changes.  - Continue metformin 1000mg  qd  Essential hypertension, benign Well-controlled. 134/71 in office today, and reports typically around this when measured at home or at drugstore.  - Continue lisinopril-HCTZ, metoprolol, amlodipine  HLD (hyperlipidemia) - Continue pravastatin  Tarri AbernethyAbigail J Elinda Bunten, MD, MPH PGY-2 [facility] Pager [phone]

## 2016-10-24 ENCOUNTER — Other Ambulatory Visit: Payer: Self-pay | Admitting: Cardiology

## 2016-11-14 ENCOUNTER — Other Ambulatory Visit: Payer: Self-pay | Admitting: Family Medicine

## 2016-11-14 DIAGNOSIS — E119 Type 2 diabetes mellitus without complications: Secondary | ICD-10-CM

## 2017-02-08 DIAGNOSIS — H52203 Unspecified astigmatism, bilateral: Secondary | ICD-10-CM | POA: Diagnosis not present

## 2017-02-08 DIAGNOSIS — H43813 Vitreous degeneration, bilateral: Secondary | ICD-10-CM | POA: Diagnosis not present

## 2017-02-08 DIAGNOSIS — H5203 Hypermetropia, bilateral: Secondary | ICD-10-CM | POA: Diagnosis not present

## 2017-02-08 DIAGNOSIS — E119 Type 2 diabetes mellitus without complications: Secondary | ICD-10-CM | POA: Diagnosis not present

## 2017-02-08 LAB — HM DIABETES EYE EXAM

## 2017-03-30 DIAGNOSIS — Z1231 Encounter for screening mammogram for malignant neoplasm of breast: Secondary | ICD-10-CM | POA: Diagnosis not present

## 2017-05-11 ENCOUNTER — Other Ambulatory Visit: Payer: Self-pay | Admitting: Internal Medicine

## 2017-05-11 DIAGNOSIS — E119 Type 2 diabetes mellitus without complications: Secondary | ICD-10-CM

## 2017-05-11 DIAGNOSIS — I10 Essential (primary) hypertension: Secondary | ICD-10-CM

## 2017-05-11 MED ORDER — AMLODIPINE BESYLATE 10 MG PO TABS: 10.0000 mg | ORAL_TABLET | Freq: Every day | ORAL | 0 refills | Status: DC

## 2017-05-11 MED ORDER — METFORMIN HCL 1000 MG PO TABS: 1000.0000 mg | ORAL_TABLET | Freq: Two times a day (BID) | ORAL | 1 refills | Status: DC

## 2017-05-11 NOTE — Telephone Encounter (Signed)
Patient came by office request refill on RX Metformin & Amlodipine. Patient also stated she would like any other RX filled that can be at this time.  [facility] on [address]. Any questions patient may be reached at [phone]

## 2017-06-19 ENCOUNTER — Ambulatory Visit (INDEPENDENT_AMBULATORY_CARE_PROVIDER_SITE_OTHER): Payer: Medicare Other | Admitting: Internal Medicine

## 2017-06-19 ENCOUNTER — Encounter: Payer: Self-pay | Admitting: Internal Medicine

## 2017-06-19 VITALS — BP 127/80 | HR 62 | Temp 97.4°F | Ht 68.0 in | Wt 182.8 lb

## 2017-06-19 DIAGNOSIS — I10 Essential (primary) hypertension: Secondary | ICD-10-CM | POA: Diagnosis not present

## 2017-06-19 DIAGNOSIS — E119 Type 2 diabetes mellitus without complications: Secondary | ICD-10-CM | POA: Diagnosis not present

## 2017-06-19 DIAGNOSIS — E785 Hyperlipidemia, unspecified: Secondary | ICD-10-CM | POA: Diagnosis not present

## 2017-06-19 LAB — POCT GLYCOSYLATED HEMOGLOBIN (HGB A1C): Hemoglobin A1C: 6.2

## 2017-06-19 MED ORDER — LISINOPRIL-HYDROCHLOROTHIAZIDE 20-25 MG PO TABS: 1.0000 | ORAL_TABLET | Freq: Every day | ORAL | 3 refills | Status: DC

## 2017-06-19 MED ORDER — PRAVASTATIN SODIUM 40 MG PO TABS: ORAL_TABLET | ORAL | 3 refills | Status: DC

## 2017-06-19 MED ORDER — METOPROLOL SUCCINATE ER 50 MG PO TB24: ORAL_TABLET | ORAL | 3 refills | Status: DC

## 2017-06-19 NOTE — Progress Notes (Signed)
   Subjective:   Patient: Wendy Velez       Birthdate: February 06, 1941       MRN: [id number]      HPI  Wendy Velez is a 76 y.o. female presenting for DM, HTN, HLD f/u.   Type II DM Patient last seen in 09/2016 for DM. Has been taking metformin 1000mg  BID most days, however some days takes 1/2 pill in evening (making 1500mg  total). Says she takes full 1000mg  on days when she has eaten more sugar or carbs. No numbness or tingling of LE. No vision changes and last saw ophtho in 03/2017. Does foot checks "frequently."  Checks blood sugar before breakfast and usually gets values between 90s and low 100s.   HTN Has not been measuring BP at home as blood pressure cuff broke. Is taking amlodipine, lisinopril-HCTZ, and metoprolol as prescribed. Denies changes in vision or recurrent headaches. Thinks she may get elevated BPs when angry but otherwise does not think she has had any high BPs.   HLD Taking atorvastatin as prescribed.   Smoking status reviewed. Patient is never smoker.   Review of Systems See HPI.     Objective:  Physical Exam  Constitutional: She is oriented to person, place, and time and well-developed, well-nourished, and in no distress.  HENT:  Head: Normocephalic and atraumatic.  Eyes: Pupils are equal, round, and reactive to light. Conjunctivae and EOM are normal. Right eye exhibits no discharge. Left eye exhibits no discharge.  Pulmonary/Chest: Effort normal. No respiratory distress.  Neurological: She is alert and oriented to person, place, and time.  Skin: Skin is warm and dry.  Psychiatric: Affect and judgment normal.   Diabetic Foot Exam - Simple   Simple Foot Form Diabetic Foot exam was performed with the following findings:  Yes 06/19/2017  3:28 PM  Visual Inspection No deformities, no ulcerations, no other skin breakdown bilaterally:  Yes Sensation Testing Intact to touch and monofilament testing bilaterally:  Yes Pulse Check Posterior Tibialis and  Dorsalis pulse intact bilaterally:  Yes Comments         Assessment & Plan:  Essential hypertension, benign Well-controlled on current regimen. BP 127/80 today.  - Continue amlodipine, lisinopril-HCTZ, and metoprolol  DM2 (diabetes mellitus, type 2) Well-controlled. A1C 6.2 today. Briefly discussed mechanism of metformin and that dose does not need to be adjusted for that day's diet. Encouraged patient to take full 1000mg  BID as prescribed. Patient voiced understanding. No abnormalities on foot exam. Patient up to date with eye exam. No reports symptoms of neuropathy.  - Continue metformin 1000mg  BID  HLD (hyperlipidemia) Continue atorvastatin   Tarri AbernethyAbigail J Jarome Trull, MD, MPH PGY-3 [facility] Pager [phone]

## 2017-06-19 NOTE — Assessment & Plan Note (Signed)
Well-controlled on current regimen. BP 127/80 today.  - Continue amlodipine, lisinopril-HCTZ, and metoprolol

## 2017-06-19 NOTE — Assessment & Plan Note (Signed)
Continue atorvastatin

## 2017-06-19 NOTE — Patient Instructions (Addendum)
It was nice seeing you again today Ms. Bomberger!  Your A1C today was 6.2 which is exactly where we want it to be. Keep up the great work!  Please continue to take all of your medications, however be sure to take the full tablets of metformin rather than half.   I will see you back in 3-6 months or sooner if you need us.   If you have any questions or concerns, please feel free to call the clinic.   Be well,  Dr. Natale MilchLancaster

## 2017-06-19 NOTE — Assessment & Plan Note (Signed)
Well-controlled. A1C 6.2 today. Briefly discussed mechanism of metformin and that dose does not need to be adjusted for that day's diet. Encouraged patient to take full 1000mg  BID as prescribed. Patient voiced understanding. No abnormalities on foot exam. Patient up to date with eye exam. No reports symptoms of neuropathy.  - Continue metformin 1000mg  BID

## 2017-07-03 ENCOUNTER — Other Ambulatory Visit: Payer: Self-pay | Admitting: Internal Medicine

## 2017-07-03 DIAGNOSIS — E785 Hyperlipidemia, unspecified: Secondary | ICD-10-CM

## 2017-08-31 ENCOUNTER — Ambulatory Visit (INDEPENDENT_AMBULATORY_CARE_PROVIDER_SITE_OTHER): Payer: Medicare Other | Admitting: *Deleted

## 2017-08-31 DIAGNOSIS — Z23 Encounter for immunization: Secondary | ICD-10-CM

## 2017-09-08 ENCOUNTER — Ambulatory Visit (INDEPENDENT_AMBULATORY_CARE_PROVIDER_SITE_OTHER): Payer: Medicare Other | Admitting: Cardiology

## 2017-09-08 VITALS — BP 120/62 | HR 74 | Ht 67.0 in | Wt 188.0 lb

## 2017-09-08 DIAGNOSIS — I471 Supraventricular tachycardia: Secondary | ICD-10-CM | POA: Diagnosis not present

## 2017-09-08 DIAGNOSIS — I421 Obstructive hypertrophic cardiomyopathy: Secondary | ICD-10-CM | POA: Diagnosis not present

## 2017-09-08 DIAGNOSIS — I10 Essential (primary) hypertension: Secondary | ICD-10-CM

## 2017-09-08 LAB — BASIC METABOLIC PANEL
BUN/Creatinine Ratio: 19 (ref 12–28)
BUN: 20 mg/dL (ref 8–27)
CALCIUM: 9.9 mg/dL (ref 8.7–10.3)
CHLORIDE: 101 mmol/L (ref 96–106)
CO2: 23 mmol/L (ref 20–29)
Creatinine, Ser: 1.07 mg/dL — ABNORMAL HIGH (ref 0.57–1.00)
GFR calc Af Amer: 57 mL/min/{1.73_m2} — ABNORMAL LOW (ref >59)
GFR calc non Af Amer: 50 mL/min/{1.73_m2} — ABNORMAL LOW (ref >59)
Glucose: 125 mg/dL — ABNORMAL HIGH (ref 65–99)
POTASSIUM: 4 mmol/L (ref 3.5–5.2)
Sodium: 139 mmol/L (ref 134–144)

## 2017-09-08 NOTE — Patient Instructions (Signed)
Medication Instructions:  Your physician recommends that you continue on your current medications as directed. Please refer to the Current Medication list given to you today.   Labwork: Your physician recommends that you have for lab work today: BMET   Testing/Procedures: Your physician has requested that you have an echocardiogram. Echocardiography is a painless test that uses sound waves to create images of your heart. It provides your doctor with information about the size and shape of your heart and how well your heart's chambers and valves are working. This procedure takes approximately one hour. There are no restrictions for this procedure.  Your physician has recommended that you wear a holter monitor. Holter monitors are medical devices that record the heart's electrical activity. Doctors most often use these monitors to diagnose arrhythmias. Arrhythmias are problems with the speed or rhythm of the heartbeat. The monitor is a small, portable device. You can wear one while you do your normal daily activities. This is usually used to diagnose what is causing palpitations/syncope (passing out).    Follow-Up: Your physician wants you to follow-up in: 1 YEAR WITH Dr. Mayford Knife.  You will receive a reminder letter in the mail two months in advance. If you don't receive a letter, please call our office to schedule the follow-up appointment.   Any Other Special Instructions Will Be Listed Below (If Applicable).     If you need a refill on your cardiac medications before your next appointment, please call your pharmacy.

## 2017-09-08 NOTE — Progress Notes (Signed)
Cardiology Office Note:    Date:  09/08/2017   ID:  Wendy Velez, Wendy Velez 09-18-1941, MRN [id number]  PCP:  Verner Mould, MD  Cardiologist:  Fransico Him, MD   Referring MD: Sydnee Levans*   Chief Complaint  Patient presents with  . Follow-up    HOCM, HTN, atrial tach    History of Present Illness:    Wendy Velez is a 76 y.o. female with a hx of  dyslipidemia and HTN.She underwent nuclear stress test for CP which showed no ischemia and 2D echo showed normal LVF with severe septal hypertrophy and chordal SAM but no LVOT resting gradient. She was started on Toprol. She has no family history of sudden cardiac death. Holter monitor showed NSR with PVC's and PAC's and nonsustained atrial tachycardia.  Past Medical History:  Diagnosis Date  . HOCM (hypertrophic obstructive cardiomyopathy) ([place])    a. Echo (11/15):  Mod LVH, mod to severe focal basal hypertrophy of septum, EF 60-65%, Gr 1 DD, Ao sclerosis no stenosis, chordal SAM, mild MR, mild LAE, atrial septal aneurysm with sugg of small PFO (consider bubble study) >> b. ETT-Myoview 6/15 with normal BP response to exercise  . Hyperlipidemia   . Hypertension     Past Surgical History:  Procedure Laterality Date  . ABDOMINAL HYSTERECTOMY  1985   still has ovaries    Current Medications: Current Meds  Medication Sig  . amLODipine (NORVASC) 10 MG tablet Take 1 tablet (10 mg total) by mouth daily.  Marland Kitchen aspirin EC 81 MG tablet Take 1 tablet (81 mg total) by mouth daily.  . Blood Glucose Monitoring Suppl (ONE TOUCH ULTRA 2) W/DEVICE KIT 1 Device by Does not apply route as needed.  Marland Kitchen glucose blood (ONE TOUCH ULTRA TEST) test strip 1 each by Other route every morning. Use as instructed  . lisinopril-hydrochlorothiazide (PRINZIDE,ZESTORETIC) 20-25 MG tablet Take 1 tablet by mouth daily.  . metFORMIN (GLUCOPHAGE) 1000 MG tablet Take 1 tablet (1,000 mg total) by mouth 2 (two) times daily with a meal.  .  metoprolol succinate (TOPROL-XL) 50 MG 24 hr tablet TAKE 1 TABLET(50 MG TOTAL) BY MOUTH DAILY  . Nutritional Supplements (ESTROVEN PO) Take by mouth.  . ONE TOUCH LANCETS MISC 1 each by Does not apply route every morning.  . pravastatin (PRAVACHOL) 40 MG tablet TAKE 1 TABLET (40 MG TOTAL) BY MOUTH DAILY.  . pravastatin (PRAVACHOL) 40 MG tablet TAKE 1 TABLET BY MOUTH DAILY     Allergies:   Patient has no known allergies.   Social History   Social History  . Marital status: Married    Spouse name: N/A  . Number of children: N/A  . Years of education: N/A   Social History Main Topics  . Smoking status: Never Smoker  . Smokeless tobacco: Never Used  . Alcohol use 4.2 oz/week    7 Glasses of wine per week  . Drug use: No  . Sexual activity: Not on file   Other Topics Concern  . Not on file   Social History Narrative  . No narrative on file     Family History: The patient's family history includes Alzheimer's disease in her mother, paternal aunt, and paternal uncle; Diabetes in her son. There is no history of Colon cancer.  ROS:   Please see the history of present illness.    ROS  All other systems reviewed and negative.   EKGs/Labs/Other Studies Reviewed:    The following studies were reviewed  today: none  EKG:  EKG is ordered today and showed NSR at 74bpm with inferior infarct and poor R wave progression.   Recent Labs: No results found for requested labs within last 8760 hours.   Recent Lipid Panel    Component Value Date/Time   CHOL 127 07/08/2015 0930   TRIG 65 07/08/2015 0930   HDL 41 (L) 07/08/2015 0930   CHOLHDL 3.1 07/08/2015 0930   VLDL 13 07/08/2015 0930   LDLCALC 73 07/08/2015 0930    Physical Exam:    VS:  BP 120/62 (BP Location: Left Arm, Patient Position: Sitting, Cuff Size: Normal)   Pulse 74   Ht '5\' 7"'$  (1.702 m)   Wt 188 lb (85.3 kg)   BMI 29.44 kg/m     Wt Readings from Last 3 Encounters:  09/08/17 188 lb (85.3 kg)  06/19/17 182 lb  12.8 oz (82.9 kg)  09/21/16 183 lb (83 kg)     GEN:  Well nourished, well developed in no acute distress HEENT: Normal NECK: No JVD; No carotid bruits LYMPHATICS: No lymphadenopathy CARDIAC: RRR, no murmurs, rubs, gallops RESPIRATORY:  Clear to auscultation without rales, wheezing or rhonchi  ABDOMEN: Soft, non-tender, non-distended MUSCULOSKELETAL:  No edema; No deformity  SKIN: Warm and dry NEUROLOGIC:  Alert and oriented x 3 PSYCHIATRIC:  Normal affect   ASSESSMENT:    1. HOCM (hypertrophic obstructive cardiomyopathy) ([place])   2. Essential hypertension, benign   3. Atrial tachycardia ([place])    PLAN:    In order of problems listed above:  1.  HOCM - she is completely asymptomatic.  She denies any chest pain, SOB, dizziness or syncope.  She will continue on Toprol XL '50mg'$  daily.  I will get a Holter Monitor to assess for arrhythmias.    2.  HTN - BP is well controlled  She will continue on Toprol XL '50mg'$  daily, amlodipine '10mg'$  daily and Lisinopril HCT.    3.  Nonsustained atrial tachycardia - this is well suppressed on BB.     Medication Adjustments/Labs and Tests Ordered: Current medicines are reviewed at length with the patient today.  Concerns regarding medicines are outlined above.  No orders of the defined types were placed in this encounter.  No orders of the defined types were placed in this encounter.   Signed, Fransico Him, MD  09/08/2017 7:57 AM    [place]

## 2017-09-25 ENCOUNTER — Other Ambulatory Visit: Payer: Self-pay

## 2017-09-25 ENCOUNTER — Ambulatory Visit (HOSPITAL_COMMUNITY): Payer: Medicare Other | Attending: Cardiology

## 2017-09-25 ENCOUNTER — Other Ambulatory Visit: Payer: Self-pay | Admitting: Cardiology

## 2017-09-25 ENCOUNTER — Encounter (INDEPENDENT_AMBULATORY_CARE_PROVIDER_SITE_OTHER): Payer: Medicare Other

## 2017-09-25 ENCOUNTER — Encounter: Payer: Self-pay | Admitting: *Deleted

## 2017-09-25 DIAGNOSIS — I471 Supraventricular tachycardia: Secondary | ICD-10-CM | POA: Diagnosis not present

## 2017-09-25 DIAGNOSIS — I422 Other hypertrophic cardiomyopathy: Secondary | ICD-10-CM | POA: Diagnosis not present

## 2017-09-25 DIAGNOSIS — I10 Essential (primary) hypertension: Secondary | ICD-10-CM | POA: Insufficient documentation

## 2017-09-25 DIAGNOSIS — E119 Type 2 diabetes mellitus without complications: Secondary | ICD-10-CM | POA: Diagnosis not present

## 2017-09-25 DIAGNOSIS — I081 Rheumatic disorders of both mitral and tricuspid valves: Secondary | ICD-10-CM | POA: Insufficient documentation

## 2017-09-25 DIAGNOSIS — I421 Obstructive hypertrophic cardiomyopathy: Secondary | ICD-10-CM

## 2017-09-25 DIAGNOSIS — E785 Hyperlipidemia, unspecified: Secondary | ICD-10-CM | POA: Diagnosis not present

## 2017-09-25 DIAGNOSIS — R000 Tachycardia, unspecified: Secondary | ICD-10-CM

## 2017-09-26 ENCOUNTER — Other Ambulatory Visit: Payer: Self-pay | Admitting: Internal Medicine

## 2017-09-26 DIAGNOSIS — I10 Essential (primary) hypertension: Secondary | ICD-10-CM

## 2017-09-26 DIAGNOSIS — E785 Hyperlipidemia, unspecified: Secondary | ICD-10-CM

## 2017-11-02 ENCOUNTER — Other Ambulatory Visit: Payer: Self-pay | Admitting: Cardiology

## 2017-11-02 ENCOUNTER — Other Ambulatory Visit: Payer: Self-pay | Admitting: Internal Medicine

## 2017-11-02 DIAGNOSIS — E119 Type 2 diabetes mellitus without complications: Secondary | ICD-10-CM

## 2017-11-03 ENCOUNTER — Other Ambulatory Visit: Payer: Self-pay | Admitting: Internal Medicine

## 2017-11-03 DIAGNOSIS — I10 Essential (primary) hypertension: Secondary | ICD-10-CM

## 2017-11-06 ENCOUNTER — Telehealth: Payer: Self-pay | Admitting: Cardiology

## 2017-11-06 MED ORDER — METOPROLOL SUCCINATE ER 50 MG PO TB24: ORAL_TABLET | ORAL | 3 refills | Status: DC

## 2017-11-06 NOTE — Telephone Encounter (Signed)
New message    Pt is asking for a call back about her medication. Please call.

## 2017-11-06 NOTE — Telephone Encounter (Signed)
Patient is stating her refill for her metoprolol was cancelled. Informed patient that refill was sent to [facility] on 06/2017. Patient stated she does not use [facility] and [facility] Informed patient prescription has been updated with the correct pharmacy. Patient verbalized understanding and thanked me for the call.

## 2017-12-12 DIAGNOSIS — E7800 Pure hypercholesterolemia, unspecified: Secondary | ICD-10-CM | POA: Diagnosis not present

## 2017-12-12 DIAGNOSIS — E119 Type 2 diabetes mellitus without complications: Secondary | ICD-10-CM | POA: Diagnosis not present

## 2017-12-12 DIAGNOSIS — L0201 Cutaneous abscess of face: Secondary | ICD-10-CM | POA: Diagnosis not present

## 2017-12-20 DIAGNOSIS — E119 Type 2 diabetes mellitus without complications: Secondary | ICD-10-CM | POA: Diagnosis not present

## 2017-12-20 DIAGNOSIS — L0201 Cutaneous abscess of face: Secondary | ICD-10-CM | POA: Diagnosis not present

## 2017-12-20 DIAGNOSIS — I10 Essential (primary) hypertension: Secondary | ICD-10-CM | POA: Diagnosis not present

## 2017-12-21 ENCOUNTER — Other Ambulatory Visit: Payer: Self-pay | Admitting: Internal Medicine

## 2017-12-21 DIAGNOSIS — I10 Essential (primary) hypertension: Secondary | ICD-10-CM

## 2017-12-21 DIAGNOSIS — E785 Hyperlipidemia, unspecified: Secondary | ICD-10-CM

## 2018-01-02 DIAGNOSIS — E119 Type 2 diabetes mellitus without complications: Secondary | ICD-10-CM | POA: Diagnosis not present

## 2018-01-02 DIAGNOSIS — E7800 Pure hypercholesterolemia, unspecified: Secondary | ICD-10-CM | POA: Diagnosis not present

## 2018-01-02 DIAGNOSIS — L0201 Cutaneous abscess of face: Secondary | ICD-10-CM | POA: Diagnosis not present

## 2018-02-13 DIAGNOSIS — E119 Type 2 diabetes mellitus without complications: Secondary | ICD-10-CM | POA: Diagnosis not present

## 2018-02-13 DIAGNOSIS — H5213 Myopia, bilateral: Secondary | ICD-10-CM | POA: Diagnosis not present

## 2018-02-13 DIAGNOSIS — Z7984 Long term (current) use of oral hypoglycemic drugs: Secondary | ICD-10-CM | POA: Diagnosis not present

## 2018-02-13 DIAGNOSIS — H52203 Unspecified astigmatism, bilateral: Secondary | ICD-10-CM | POA: Diagnosis not present

## 2018-05-08 ENCOUNTER — Ambulatory Visit: Payer: Medicare Other | Admitting: Internal Medicine

## 2018-05-28 ENCOUNTER — Encounter: Payer: Self-pay | Admitting: Internal Medicine

## 2018-05-28 ENCOUNTER — Ambulatory Visit: Payer: Medicare Other | Admitting: Internal Medicine

## 2018-05-28 DIAGNOSIS — F22 Delusional disorders: Secondary | ICD-10-CM | POA: Diagnosis not present

## 2018-05-28 DIAGNOSIS — L0201 Cutaneous abscess of face: Secondary | ICD-10-CM | POA: Diagnosis not present

## 2018-05-28 NOTE — Progress Notes (Signed)
Patient refused to allow me to take her vitals and declined answering any questions. I reported this to Dr. Natale MilchLancaster.

## 2018-05-28 NOTE — Assessment & Plan Note (Signed)
Upon further chart review, this has been ongoing since at least 2015, with concerns at that time that she had breast cancer despite neg mammograms as well. Patient now most concerned that her mammogram results are being tampered with, despite reassurance from multiple physicians that this is not the case. Discussed common signs of breast cancer with patient, namely breast mass which patient denies, however she is still adamant that she needs further imaging. Discussed that the sx she is describing (chest pain, trouble swallowing, sore throat) are more consistent with GERD which can be treated with medication, however patient refuses trial of PPI. Patient also refusing examination, so cannot palpate chest to determine possibility of MSK chest pain. Informed patient that additional breast imaging is not indicated or appropriate at this time, and that I would not place an order for such. Patient said that there was no need to continue with the appointment.  Regarding patient's claims that she is going to stop taking her meds because she is going to die of cancer anyway, patient not actively suicidal, so do not feel that psych admission is appropriate at this time. Will ensure patient has refills of all chronic medications though she refused to discuss chronic issues today. Patient also lives with her husband who was present at today's appt and agrees that patient does not need further work-up. Reassuring that she has an appropriate guardian.

## 2018-05-28 NOTE — Progress Notes (Signed)
Subjective:   Patient: Wendy Velez       Birthdate: Jul 05, 1941       MRN: [id number]      HPI  Wendy Velez is a 77 y.o. female presenting for concern for breast cancer.   Concern for breast cancer Patient is requesting referral to [facility] for "top of the line breast imaging," as she is convinced she has breast cancer. She says she has had four mammograms already, two at [facility] in [place], one in [place], and one elsewhere, all of which have been negative, however she is convinced someone has been tampering with the results, and that she actually does have breast cancer. She has been seen by this concern for multiple other physicians as well, all of which have told her that she does not need additional imaging. She thinks she has breast cancer because she has pain in the center of her chest, occasionally has difficulty swallowing, and has had a sore throat for two months. She denies any masses or lumps, however endorses feeling a hole in her L breast. She says she has lost 10 pounds within the past year as well. Patient says that she has a few days left of her current chronic medications, however after that she plans to stop taking her medications, as she knows she is going to die from cancer soon anyway.   Of note, patient says she has not returned to clinic since last July despite her chronic health issues because she had to sign a paper prior to having her blood drawn for basic labwork, which she has never had to do before, and she found this concerning.   Smoking status reviewed. Patient is never smoker.   Review of Systems See HPI.     Objective:  Physical Exam  Constitutional: She is oriented to person, place, and time. She appears well-developed and well-nourished. No distress.  HENT:  Head: Normocephalic and atraumatic.  Pulmonary/Chest: Effort normal. No respiratory distress.  Neurological: She is alert and oriented to person, place, and time.  Psychiatric: She has  a normal mood and affect.  Patient refuses further examination.     Assessment & Plan:  Paranoia [facility](HCC) Upon further chart review, this has been ongoing since at least 2015, with concerns at that time that she had breast cancer despite neg mammograms as well. Patient now most concerned that her mammogram results are being tampered with, despite reassurance from multiple physicians that this is not the case. Discussed common signs of breast cancer with patient, namely breast mass which patient denies, however she is still adamant that she needs further imaging. Discussed that the sx she is describing (chest pain, trouble swallowing, sore throat) are more consistent with GERD which can be treated with medication, however patient refuses trial of PPI. Patient also refusing examination, so cannot palpate chest to determine possibility of MSK chest pain. Informed patient that additional breast imaging is not indicated or appropriate at this time, and that I would not place an order for such. Patient said that there was no need to continue with the appointment.  Regarding patient's claims that she is going to stop taking her meds because she is going to die of cancer anyway, patient not actively suicidal, so do not feel that psych admission is appropriate at this time. Will ensure patient has refills of all chronic medications though she refused to discuss chronic issues today. Patient also lives with her husband who was present at today's appt and  agrees that patient does not need further work-up. Reassuring that she has an appropriate guardian.    Tarri Abernethy, MD, MPH PGY-3 [facility] Pager [phone]

## 2018-06-11 DIAGNOSIS — F29 Unspecified psychosis not due to a substance or known physiological condition: Secondary | ICD-10-CM | POA: Diagnosis not present

## 2018-06-11 DIAGNOSIS — L0201 Cutaneous abscess of face: Secondary | ICD-10-CM | POA: Diagnosis not present

## 2018-08-29 ENCOUNTER — Ambulatory Visit (INDEPENDENT_AMBULATORY_CARE_PROVIDER_SITE_OTHER): Payer: Medicare Other

## 2018-08-29 DIAGNOSIS — Z23 Encounter for immunization: Secondary | ICD-10-CM | POA: Diagnosis not present

## 2018-09-01 ENCOUNTER — Other Ambulatory Visit: Payer: Self-pay | Admitting: Internal Medicine

## 2018-09-01 DIAGNOSIS — E785 Hyperlipidemia, unspecified: Secondary | ICD-10-CM

## 2018-09-01 DIAGNOSIS — I10 Essential (primary) hypertension: Secondary | ICD-10-CM

## 2018-10-06 ENCOUNTER — Other Ambulatory Visit: Payer: Self-pay | Admitting: Internal Medicine

## 2018-10-06 DIAGNOSIS — I10 Essential (primary) hypertension: Secondary | ICD-10-CM

## 2018-11-12 ENCOUNTER — Other Ambulatory Visit: Payer: Self-pay | Admitting: Cardiology

## 2018-11-13 ENCOUNTER — Other Ambulatory Visit: Payer: Self-pay | Admitting: Cardiology

## 2018-11-15 ENCOUNTER — Other Ambulatory Visit: Payer: Self-pay

## 2018-11-15 DIAGNOSIS — E119 Type 2 diabetes mellitus without complications: Secondary | ICD-10-CM

## 2018-11-15 MED ORDER — METFORMIN HCL 1000 MG PO TABS: ORAL_TABLET | ORAL | 0 refills | Status: DC

## 2018-12-05 ENCOUNTER — Other Ambulatory Visit: Payer: Self-pay | Admitting: Cardiology

## 2018-12-06 ENCOUNTER — Other Ambulatory Visit: Payer: Self-pay | Admitting: *Deleted

## 2018-12-06 DIAGNOSIS — I10 Essential (primary) hypertension: Secondary | ICD-10-CM

## 2018-12-09 MED ORDER — AMLODIPINE BESYLATE 10 MG PO TABS: 10.0000 mg | ORAL_TABLET | Freq: Every day | ORAL | 0 refills | Status: DC

## 2018-12-10 NOTE — Addendum Note (Signed)
Addended by: Jone Baseman D on: 12/10/2018 06:00 PM   Modules accepted: Orders

## 2019-01-01 ENCOUNTER — Other Ambulatory Visit: Payer: Self-pay

## 2019-01-01 DIAGNOSIS — I10 Essential (primary) hypertension: Secondary | ICD-10-CM

## 2019-01-01 MED ORDER — LISINOPRIL-HYDROCHLOROTHIAZIDE 20-25 MG PO TABS: 1.0000 | ORAL_TABLET | Freq: Every day | ORAL | 0 refills | Status: DC

## 2019-01-01 NOTE — Telephone Encounter (Signed)
Please schedule Wendy Velez for a diabetes and hypertension follow up at her earliest convenience, for additional refills.

## 2019-01-29 DIAGNOSIS — N644 Mastodynia: Secondary | ICD-10-CM | POA: Diagnosis not present

## 2019-02-04 ENCOUNTER — Other Ambulatory Visit: Payer: Self-pay

## 2019-02-04 DIAGNOSIS — E785 Hyperlipidemia, unspecified: Secondary | ICD-10-CM

## 2019-02-04 DIAGNOSIS — E119 Type 2 diabetes mellitus without complications: Secondary | ICD-10-CM

## 2019-02-04 MED ORDER — PRAVASTATIN SODIUM 40 MG PO TABS: 40.0000 mg | ORAL_TABLET | Freq: Every day | ORAL | 0 refills | Status: DC

## 2019-02-04 MED ORDER — METFORMIN HCL 1000 MG PO TABS: ORAL_TABLET | ORAL | 0 refills | Status: DC

## 2019-02-08 ENCOUNTER — Telehealth: Payer: Self-pay

## 2019-02-08 MED ORDER — METOPROLOL SUCCINATE ER 50 MG PO TB24: ORAL_TABLET | ORAL | 0 refills | Status: DC

## 2019-02-08 NOTE — Telephone Encounter (Signed)
Patient, spoke with Jasmine December at [facility], she needs a refill of Metoprolol, she only has 5 pills left.

## 2019-02-08 NOTE — Telephone Encounter (Signed)
Medication refilled, attempted to call, no voicemail available.

## 2019-02-08 NOTE — Telephone Encounter (Signed)
Refill for 60 days but no other refills on this until she keeps her appt in April

## 2019-02-11 NOTE — Telephone Encounter (Signed)
Spoke with the patient, she stated she got the medication and will come to her follow up visit.

## 2019-02-18 ENCOUNTER — Other Ambulatory Visit: Payer: Self-pay

## 2019-02-18 DIAGNOSIS — I10 Essential (primary) hypertension: Secondary | ICD-10-CM

## 2019-02-18 DIAGNOSIS — E1136 Type 2 diabetes mellitus with diabetic cataract: Secondary | ICD-10-CM | POA: Diagnosis not present

## 2019-02-18 DIAGNOSIS — Z7984 Long term (current) use of oral hypoglycemic drugs: Secondary | ICD-10-CM | POA: Diagnosis not present

## 2019-02-18 DIAGNOSIS — H25813 Combined forms of age-related cataract, bilateral: Secondary | ICD-10-CM | POA: Diagnosis not present

## 2019-02-18 MED ORDER — LISINOPRIL-HYDROCHLOROTHIAZIDE 20-25 MG PO TABS: 1.0000 | ORAL_TABLET | Freq: Every day | ORAL | 0 refills | Status: DC

## 2019-02-19 ENCOUNTER — Telehealth: Payer: Self-pay | Admitting: *Deleted

## 2019-02-19 DIAGNOSIS — I10 Essential (primary) hypertension: Secondary | ICD-10-CM

## 2019-02-19 NOTE — Telephone Encounter (Signed)
-----   Message from Allayne Stack, DO sent at 02/18/2019  7:37 PM EDT ----- Regarding: Schedule DM/HTN f/u Can someone please call this patient and schedule a blood pressure/diabetes follow up in the next month or so at her convenience? She has not been seen in our clinic for this since 2018.   Thank you!  Dr Annia Friendly

## 2019-02-19 NOTE — Telephone Encounter (Signed)
Attempted to call pt, no answer/voicemail set up. Will try again later. Chantae Soo, CMA  

## 2019-02-21 NOTE — Telephone Encounter (Signed)
Called patient and informed her that she needed to make an appointment for DM/HTN.    Patient states that she will call back later and make it herself.  Glennie Hawk, CMA

## 2019-03-18 ENCOUNTER — Other Ambulatory Visit: Payer: Self-pay | Admitting: *Deleted

## 2019-03-18 DIAGNOSIS — I10 Essential (primary) hypertension: Secondary | ICD-10-CM

## 2019-03-18 MED ORDER — LISINOPRIL-HYDROCHLOROTHIAZIDE 20-25 MG PO TABS: 1.0000 | ORAL_TABLET | Freq: Every day | ORAL | 2 refills | Status: DC

## 2019-03-27 ENCOUNTER — Telehealth: Payer: Self-pay | Admitting: Cardiology

## 2019-03-27 NOTE — Telephone Encounter (Signed)
Phone only/verbal consent for phone 03/27/19

## 2019-03-31 NOTE — Progress Notes (Signed)
Virtual Visit via Telephone hiatuses Laniece note   This visit type was conducted due to national recommendations for restrictions regarding the COVID-19 Pandemic (e.g. social distancing) in an effort to limit this patient's exposure and mitigate transmission in our community.  Due to her co-morbid illnesses, this patient is at least at moderate risk for complications without adequate follow up.  This format is felt to be most appropriate for this patient at this time.  All issues noted in this document were discussed and addressed.  A limited physical exam was performed with this format.  Please refer to the patient's chart for her consent to telehealth for [facility].   Evaluation Performed:  Follow-up visit  This visit type was conducted due to national recommendations for restrictions regarding the COVID-19 Pandemic (e.g. social distancing).  This format is felt to be most appropriate for this patient at this time.  All issues noted in this document were discussed and addressed.  No physical exam was performed (except for noted visual exam findings with Video Visits).  Please refer to the patient's chart (MyChart message for video visits and phone note for telephone visits) for the patient's consent to telehealth for [facility]  Date:  04/01/2019   ID:  Shawonda, Kerce [birth year], MRN [id number]  Patient Location:  Home  Provider location:   [place]  PCP:  Patriciaann Clan, DO  Cardiologist:  Fransico Him, MD Electrophysiologist:  None   Chief Complaint:  HOCM, HTN, atrial tachycardia  History of Present Illness:    NIKKITA ADEYEMI is a 78 y.o. female who presents via audio/video conferencing for a telehealth visit today.    MARLIE KUENNEN is a 78 y.o. female with a hx of dyslipidemia and HTN.She underwent nuclear stress test for CP which showed no ischemia and 2D echo showed normal LVF with severe septal hypertrophy and chordal SAM but no LVOT resting  gradient. She was started on Toprol. She has no family history of sudden cardiac death. Holter monitor showed NSR with PVC's and PAC's and nonsustained atrial tachycardia.She is here today for followup and is doing well.  She denies any chest pain or pressure, SOB, DOE, PND, orthopnea, LE edema, dizziness, palpitations or syncope. She is compliant with her meds and is tolerating meds with no SE.    The patient does not have symptoms concerning for COVID-19 infection (fever, chills, cough, or new shortness of breath).    Prior CV studies:   The following studies were reviewed today:  2D echo  Past Medical History:  Diagnosis Date  . HOCM (hypertrophic obstructive cardiomyopathy) ([place])    a. Echo (11/15):  Mod LVH, mod to severe focal basal hypertrophy of septum, EF 60-65%, Gr 1 DD, Ao sclerosis no stenosis, chordal SAM, mild MR, mild LAE, atrial septal aneurysm with sugg of small PFO (consider bubble study) >> b. ETT-Myoview 6/15 with normal BP response to exercise  . Hyperlipidemia   . Hypertension    Past Surgical History:  Procedure Laterality Date  . ABDOMINAL HYSTERECTOMY  1985   still has ovaries     Current Meds  Medication Sig  . amLODipine (NORVASC) 10 MG tablet Take 1 tablet (10 mg total) by mouth daily. Needs follow up for further refills.  Marland Kitchen aspirin EC 81 MG tablet Take 1 tablet (81 mg total) by mouth daily.  . Blood Glucose Monitoring Suppl (ONE TOUCH ULTRA 2) W/DEVICE KIT 1 Device by Does not apply route as needed.  Marland Kitchen  glucose blood (ONE TOUCH ULTRA TEST) test strip 1 each by Other route every morning. Use as instructed  . lisinopril-hydrochlorothiazide (PRINZIDE,ZESTORETIC) 20-25 MG tablet Take 1 tablet by mouth daily. Needs BP and DM follow up.  . metFORMIN (GLUCOPHAGE) 1000 MG tablet TAKE 1 TABLET(1000 MG) BY MOUTH TWICE DAILY WITH A MEAL.  . metoprolol succinate (TOPROL-XL) 50 MG 24 hr tablet TAKE 1 TABLET BY MOUTH DAILY.  Marland Kitchen Nutritional Supplements (ESTROVEN PO)  Take by mouth.  . ONE TOUCH LANCETS MISC 1 each by Does not apply route every morning.  . pravastatin (PRAVACHOL) 40 MG tablet Take 1 tablet (40 mg total) by mouth daily.     Allergies:   Patient has no known allergies.   Social History   Tobacco Use  . Smoking status: Never Smoker  . Smokeless tobacco: Never Used  Substance Use Topics  . Alcohol use: Yes    Alcohol/week: 7.0 standard drinks    Types: 7 Glasses of wine per week  . Drug use: No     Family Hx: The patient's family history includes Alzheimer's disease in her mother, paternal aunt, and paternal uncle; Diabetes in her son. There is no history of Colon cancer.  ROS:   Please see the history of present illness.     All other systems reviewed and are negative.   Labs/Other Tests and Data Reviewed:    Recent Labs: No results found for requested labs within last 8760 hours.   Recent Lipid Panel Lab Results  Component Value Date/Time   CHOL 127 07/08/2015 09:30 AM   TRIG 65 07/08/2015 09:30 AM   HDL 41 (L) 07/08/2015 09:30 AM   CHOLHDL 3.1 07/08/2015 09:30 AM   LDLCALC 73 07/08/2015 09:30 AM    Wt Readings from Last 3 Encounters:  05/28/18 178 lb (80.7 kg)  09/08/17 188 lb (85.3 kg)  06/19/17 182 lb 12.8 oz (82.9 kg)     Objective:    Vital Signs:  Ht _0  (1.702 m)   BMI 27.88 kg/m    Due to technical problems with video her visit was changed to a telephone visit and no PE was done  ASSESSMENT & PLAN:    1.  HOCM - she was noted to have significant LVH and chordal Sam on echo but no significant dynamic outflow gradient.  She is asymptomatic with no chest pain or shortness of breath.  She has had no dizzy spells or palpitation or syncope.  She will continue on Toprol-XL 50 mg daily.  Her last Holter monitor was in 2018 showing nonsustained atrial tachycardia.  I will repeat this to rule out ventricular arrhythmias.  2.  Hypertension - she just bought a new BP cuff but it is not working correct.    She will continue on Toprol-XL 50 mg daily, amlodipine 10 mg daily and lisinopril HCT 20-25 mg daily.  Creatinine was stable a year ago but I do not have one since then.  I will get a bmet in July once the COVID crisis has improved.  3.  Nonsustained atrial tachycardia -she not had any palpitations, dizziness or presyncope.  She will continue on Toprol-XL 50 mg daily.  4.  Type 2 diabetes mellitus -her hemoglobin A1c was 6.2 on 06/19/2017.  She is on metformin 1047m twice daily.  This is followed by her PCP.  5.  COVID-19 Education:The signs and symptoms of COVID-19 were discussed with the patient and how to seek care for testing (follow up with PCP or  arrange E-visit).  The importance of social distancing was discussed today.  Patient Risk:   After full review of this patient's clinical status, I feel that they are at least moderate risk at this time.  Time:   Today, I have spent 15 minutes directly with the patient on video discussing medical problems including HTN, sx of HOCM and atrial tach.  We also reviewed the symptoms of COVID 19 and the ways to protect against contracting the virus with telehealth technology.  I spent an additional 5 minutes reviewing patient's chart including 2D echo and labs.  Medication Adjustments/Labs and Tests Ordered: Current medicines are reviewed at length with the patient today.  Concerns regarding medicines are outlined above.  Tests Ordered: No orders of the defined types were placed in this encounter.  Medication Changes: No orders of the defined types were placed in this encounter.   Disposition:  Follow up in 1 year(s)  Signed, Fransico Him, MD  04/01/2019 11:36 AM    [facility]

## 2019-04-01 ENCOUNTER — Telehealth: Payer: Self-pay | Admitting: *Deleted

## 2019-04-01 ENCOUNTER — Other Ambulatory Visit: Payer: Self-pay

## 2019-04-01 ENCOUNTER — Encounter: Payer: Self-pay | Admitting: Cardiology

## 2019-04-01 ENCOUNTER — Telehealth (INDEPENDENT_AMBULATORY_CARE_PROVIDER_SITE_OTHER): Payer: Medicare Other | Admitting: Cardiology

## 2019-04-01 VITALS — Ht 67.0 in

## 2019-04-01 DIAGNOSIS — I421 Obstructive hypertrophic cardiomyopathy: Secondary | ICD-10-CM | POA: Diagnosis not present

## 2019-04-01 DIAGNOSIS — E119 Type 2 diabetes mellitus without complications: Secondary | ICD-10-CM

## 2019-04-01 DIAGNOSIS — I4719 Other supraventricular tachycardia: Secondary | ICD-10-CM

## 2019-04-01 DIAGNOSIS — N182 Chronic kidney disease, stage 2 (mild): Secondary | ICD-10-CM

## 2019-04-01 DIAGNOSIS — I471 Supraventricular tachycardia: Secondary | ICD-10-CM

## 2019-04-01 DIAGNOSIS — E1122 Type 2 diabetes mellitus with diabetic chronic kidney disease: Secondary | ICD-10-CM

## 2019-04-01 DIAGNOSIS — I10 Essential (primary) hypertension: Secondary | ICD-10-CM

## 2019-04-01 DIAGNOSIS — Z7189 Other specified counseling: Secondary | ICD-10-CM

## 2019-04-01 MED ORDER — METOPROLOL SUCCINATE ER 50 MG PO TB24: 50.0000 mg | ORAL_TABLET | Freq: Every day | ORAL | 3 refills | Status: DC

## 2019-04-01 MED ORDER — LISINOPRIL-HYDROCHLOROTHIAZIDE 20-25 MG PO TABS: 1.0000 | ORAL_TABLET | Freq: Every day | ORAL | 3 refills | Status: DC

## 2019-04-01 MED ORDER — AMLODIPINE BESYLATE 10 MG PO TABS: 10.0000 mg | ORAL_TABLET | Freq: Every day | ORAL | 3 refills | Status: DC

## 2019-04-01 NOTE — Patient Instructions (Addendum)
Medication Instructions:  Your physician recommends that you continue on your current medications as directed. Please refer to the Current Medication list given to you today.  If you need a refill on your cardiac medications before your next appointment, please call your pharmacy.   Lab work: BMET: Call the office to schedule in July.  If you have labs (blood work) drawn today and your tests are completely normal, you will receive your results only by: Marland Kitchen MyChart Message (if you have MyChart) OR . A paper copy in the mail If you have any lab test that is abnormal or we need to change your treatment, we will call you to review the results.  Testing/Procedures: Schedule a Zio Patch 24 hour monitor.  Follow-Up: At [facility], you and your health needs are our priority.  As part of our continuing mission to provide you with exceptional heart care, we have created designated Provider Care Teams.  These Care Teams include your primary Cardiologist (physician) and Advanced Practice Providers (APPs -  Physician Assistants and Nurse Practitioners) who all work together to provide you with the care you need, when you need it. You will need a follow up appointment in 1 years.  Please call our office 2 months in advance to schedule this appointment.  You may see Dr. Mayford Knife or one of the following Advanced Practice Providers on your designated Care Team:   Strang, PA-C Ronie Spies, PA-C . Jacolyn Reedy, PA-C

## 2019-04-01 NOTE — Telephone Encounter (Signed)
Called to arrange holter monitor.  No answer, No DPR fpr voice mail.

## 2019-04-01 NOTE — Addendum Note (Signed)
Addended by: Dustin Flock on: 04/01/2019 02:18 PM   Modules accepted: Orders

## 2019-04-08 NOTE — Telephone Encounter (Signed)
New message   Patient is returning your call about receiving the holter monitor. Please call the patient.

## 2019-04-08 NOTE — Telephone Encounter (Signed)
Patient enrolled for [employer] to mail a 3 day ZIO XT long term holter monitor to her home as a COVID 19 replacement for a 24 hour holter monitor. Instructions reviewed briefly as they will be included in her monitor kit.

## 2019-04-13 ENCOUNTER — Other Ambulatory Visit (INDEPENDENT_AMBULATORY_CARE_PROVIDER_SITE_OTHER): Payer: Medicare Other

## 2019-04-13 DIAGNOSIS — R000 Tachycardia, unspecified: Secondary | ICD-10-CM

## 2019-04-13 DIAGNOSIS — I421 Obstructive hypertrophic cardiomyopathy: Secondary | ICD-10-CM

## 2019-04-25 ENCOUNTER — Other Ambulatory Visit: Payer: Self-pay | Admitting: Cardiology

## 2019-04-25 ENCOUNTER — Other Ambulatory Visit: Payer: Self-pay

## 2019-04-25 DIAGNOSIS — I421 Obstructive hypertrophic cardiomyopathy: Secondary | ICD-10-CM

## 2019-04-25 DIAGNOSIS — R000 Tachycardia, unspecified: Secondary | ICD-10-CM

## 2019-05-07 ENCOUNTER — Other Ambulatory Visit: Payer: Self-pay

## 2019-05-07 DIAGNOSIS — E119 Type 2 diabetes mellitus without complications: Secondary | ICD-10-CM

## 2019-05-07 DIAGNOSIS — E785 Hyperlipidemia, unspecified: Secondary | ICD-10-CM

## 2019-05-07 MED ORDER — PRAVASTATIN SODIUM 40 MG PO TABS: 40.0000 mg | ORAL_TABLET | Freq: Every day | ORAL | 0 refills | Status: DC

## 2019-05-07 MED ORDER — METFORMIN HCL 1000 MG PO TABS: ORAL_TABLET | ORAL | 0 refills | Status: DC

## 2019-05-27 ENCOUNTER — Telehealth: Payer: Self-pay

## 2019-05-27 NOTE — Telephone Encounter (Signed)
Blood Pressure values: 124/65, 118/61, 121/66, 123/68, 135/70.  The patient stated she had to adjust to the medication, but she feels fine now. She does not want anymore medication.

## 2019-05-28 NOTE — Telephone Encounter (Signed)
BPs look good.  Continue on current meds

## 2019-05-28 NOTE — Telephone Encounter (Signed)
Attempted to call the pt back and no answer then hung up.  Will forward this for further follow-up to Dr. Theodosia Blender covering RN.

## 2019-05-29 NOTE — Telephone Encounter (Signed)
Spoke with the patient, she expressed understanding and had no further questions.  

## 2019-06-10 ENCOUNTER — Other Ambulatory Visit: Payer: Medicare Other | Admitting: *Deleted

## 2019-06-10 ENCOUNTER — Other Ambulatory Visit: Payer: Self-pay

## 2019-06-10 DIAGNOSIS — I10 Essential (primary) hypertension: Secondary | ICD-10-CM

## 2019-06-10 DIAGNOSIS — I421 Obstructive hypertrophic cardiomyopathy: Secondary | ICD-10-CM | POA: Diagnosis not present

## 2019-06-10 LAB — BASIC METABOLIC PANEL
BUN/Creatinine Ratio: 19 (ref 12–28)
BUN: 18 mg/dL (ref 8–27)
CO2: 23 mmol/L (ref 20–29)
Calcium: 9.9 mg/dL (ref 8.7–10.3)
Chloride: 103 mmol/L (ref 96–106)
Creatinine, Ser: 0.94 mg/dL (ref 0.57–1.00)
GFR calc Af Amer: 67 mL/min/{1.73_m2} (ref >59)
GFR calc non Af Amer: 58 mL/min/{1.73_m2} — ABNORMAL LOW (ref >59)
Glucose: 114 mg/dL — ABNORMAL HIGH (ref 65–99)
Potassium: 4.3 mmol/L (ref 3.5–5.2)
Sodium: 140 mmol/L (ref 134–144)

## 2019-06-11 ENCOUNTER — Encounter: Payer: Self-pay | Admitting: Cardiology

## 2019-06-11 NOTE — Telephone Encounter (Signed)
New Message   Patient calling back for lab results.  Patient would like to speak to Dr. Radford Pax if possible.

## 2019-06-12 NOTE — Telephone Encounter (Signed)
See result note, error

## 2019-07-30 ENCOUNTER — Other Ambulatory Visit: Payer: Self-pay | Admitting: *Deleted

## 2019-07-30 DIAGNOSIS — E785 Hyperlipidemia, unspecified: Secondary | ICD-10-CM

## 2019-07-30 DIAGNOSIS — E119 Type 2 diabetes mellitus without complications: Secondary | ICD-10-CM

## 2019-07-31 MED ORDER — METFORMIN HCL 1000 MG PO TABS: ORAL_TABLET | ORAL | 0 refills | Status: DC

## 2019-07-31 MED ORDER — PRAVASTATIN SODIUM 40 MG PO TABS: 40.0000 mg | ORAL_TABLET | Freq: Every day | ORAL | 3 refills | Status: DC

## 2019-07-31 NOTE — Telephone Encounter (Signed)
Patient unfortunately never scheduled an appointment to be seen for HTN/DM after contacting several months ago. Patient needs to follow up for her DM/HTN for further medication management, please call to have her schedule this. Thank you!   Best,  Patriciaann Clan, DO

## 2019-10-29 ENCOUNTER — Other Ambulatory Visit: Payer: Self-pay

## 2019-10-29 DIAGNOSIS — E119 Type 2 diabetes mellitus without complications: Secondary | ICD-10-CM

## 2019-10-30 ENCOUNTER — Other Ambulatory Visit: Payer: Self-pay | Admitting: Family Medicine

## 2019-10-30 DIAGNOSIS — E119 Type 2 diabetes mellitus without complications: Secondary | ICD-10-CM

## 2019-10-30 MED ORDER — METFORMIN HCL 1000 MG PO TABS: ORAL_TABLET | ORAL | 0 refills | Status: DC

## 2019-10-30 NOTE — Telephone Encounter (Signed)
This patient needs an appointment.  Looks like she hasn't been seen here since 2019

## 2020-01-19 ENCOUNTER — Ambulatory Visit: Payer: Medicare Other

## 2020-02-03 ENCOUNTER — Encounter: Payer: Self-pay | Admitting: Internal Medicine

## 2020-02-03 DIAGNOSIS — Z1231 Encounter for screening mammogram for malignant neoplasm of breast: Secondary | ICD-10-CM | POA: Diagnosis not present

## 2020-02-20 ENCOUNTER — Encounter: Payer: Self-pay | Admitting: Family Medicine

## 2020-02-20 DIAGNOSIS — E119 Type 2 diabetes mellitus without complications: Secondary | ICD-10-CM | POA: Diagnosis not present

## 2020-02-20 DIAGNOSIS — H5203 Hypermetropia, bilateral: Secondary | ICD-10-CM | POA: Diagnosis not present

## 2020-02-20 DIAGNOSIS — H52203 Unspecified astigmatism, bilateral: Secondary | ICD-10-CM | POA: Diagnosis not present

## 2020-02-20 DIAGNOSIS — H25813 Combined forms of age-related cataract, bilateral: Secondary | ICD-10-CM | POA: Diagnosis not present

## 2020-02-20 LAB — HM DIABETES EYE EXAM

## 2020-03-11 ENCOUNTER — Other Ambulatory Visit: Payer: Self-pay

## 2020-03-11 DIAGNOSIS — I10 Essential (primary) hypertension: Secondary | ICD-10-CM

## 2020-03-11 MED ORDER — METOPROLOL SUCCINATE ER 50 MG PO TB24: 50.0000 mg | ORAL_TABLET | Freq: Every day | ORAL | 0 refills | Status: DC

## 2020-03-11 MED ORDER — AMLODIPINE BESYLATE 10 MG PO TABS: 10.0000 mg | ORAL_TABLET | Freq: Every day | ORAL | 0 refills | Status: DC

## 2020-03-13 ENCOUNTER — Other Ambulatory Visit: Payer: Self-pay | Admitting: Cardiology

## 2020-03-13 DIAGNOSIS — I10 Essential (primary) hypertension: Secondary | ICD-10-CM

## 2020-03-18 ENCOUNTER — Other Ambulatory Visit: Payer: Self-pay

## 2020-03-18 ENCOUNTER — Other Ambulatory Visit: Payer: Self-pay | Admitting: Cardiology

## 2020-03-18 DIAGNOSIS — I10 Essential (primary) hypertension: Secondary | ICD-10-CM

## 2020-03-18 MED ORDER — LISINOPRIL-HYDROCHLOROTHIAZIDE 20-25 MG PO TABS: 1.0000 | ORAL_TABLET | Freq: Every day | ORAL | 0 refills | Status: DC

## 2020-03-25 ENCOUNTER — Other Ambulatory Visit: Payer: Self-pay | Admitting: Cardiology

## 2020-03-25 DIAGNOSIS — I10 Essential (primary) hypertension: Secondary | ICD-10-CM

## 2020-04-24 ENCOUNTER — Other Ambulatory Visit: Payer: Self-pay | Admitting: Cardiology

## 2020-04-24 DIAGNOSIS — I10 Essential (primary) hypertension: Secondary | ICD-10-CM

## 2020-05-02 ENCOUNTER — Other Ambulatory Visit: Payer: Self-pay | Admitting: Cardiology

## 2020-05-02 DIAGNOSIS — I10 Essential (primary) hypertension: Secondary | ICD-10-CM

## 2020-05-20 ENCOUNTER — Other Ambulatory Visit: Payer: Self-pay | Admitting: Cardiology

## 2020-05-20 DIAGNOSIS — I10 Essential (primary) hypertension: Secondary | ICD-10-CM

## 2020-05-25 ENCOUNTER — Other Ambulatory Visit: Payer: Self-pay

## 2020-05-25 DIAGNOSIS — I10 Essential (primary) hypertension: Secondary | ICD-10-CM

## 2020-05-25 MED ORDER — LISINOPRIL-HYDROCHLOROTHIAZIDE 20-25 MG PO TABS: 1.0000 | ORAL_TABLET | Freq: Every day | ORAL | 0 refills | Status: DC

## 2020-07-13 ENCOUNTER — Other Ambulatory Visit: Payer: Self-pay | Admitting: Cardiology

## 2020-07-13 DIAGNOSIS — I10 Essential (primary) hypertension: Secondary | ICD-10-CM

## 2020-08-04 ENCOUNTER — Telehealth: Payer: Self-pay | Admitting: Cardiology

## 2020-08-25 ENCOUNTER — Encounter: Payer: Self-pay | Admitting: Cardiology

## 2020-08-25 ENCOUNTER — Ambulatory Visit: Payer: Medicare Other | Admitting: Cardiology

## 2020-08-25 ENCOUNTER — Other Ambulatory Visit: Payer: Self-pay

## 2020-08-25 VITALS — BP 160/92 | HR 81 | Ht 67.0 in | Wt 182.2 lb

## 2020-08-25 DIAGNOSIS — I471 Supraventricular tachycardia: Secondary | ICD-10-CM

## 2020-08-25 DIAGNOSIS — E119 Type 2 diabetes mellitus without complications: Secondary | ICD-10-CM | POA: Diagnosis not present

## 2020-08-25 DIAGNOSIS — I10 Essential (primary) hypertension: Secondary | ICD-10-CM

## 2020-08-25 DIAGNOSIS — I421 Obstructive hypertrophic cardiomyopathy: Secondary | ICD-10-CM

## 2020-08-25 MED ORDER — AMLODIPINE BESYLATE 10 MG PO TABS: 10.0000 mg | ORAL_TABLET | Freq: Every day | ORAL | 3 refills | Status: AC

## 2020-08-25 MED ORDER — METOPROLOL SUCCINATE ER 50 MG PO TB24: 50.0000 mg | ORAL_TABLET | Freq: Every day | ORAL | 3 refills | Status: AC

## 2020-08-25 MED ORDER — LISINOPRIL-HYDROCHLOROTHIAZIDE 20-25 MG PO TABS: 1.0000 | ORAL_TABLET | Freq: Every day | ORAL | 3 refills | Status: AC

## 2020-08-25 NOTE — Progress Notes (Addendum)
Date:  08/25/2020   ID:  Wendy Velez, Wendy Velez [birth year], MRN [id number]   PCP:  Allayne Stack, DO  Cardiologist:  Armanda Magic, MD Electrophysiologist:  None   Chief Complaint:  HOCM, HTN, atrial tachycardia  History of Present Illness:       Wendy Velez is a 79 y.o. female with a hx of dyslipidemia and HTN.She underwent nuclear stress test for CP which showed no ischemia and 2D echo showed normal LVF with severe septal hypertrophy and chordal SAM but no LVOT resting gradient. She was started on Toprol. She has no family history of sudden cardiac death. Holter monitor showed NSR with PVC's and PAC's and nonsustained atrial tachycardia.She is here today for followup and is doing well.  She denies any chest pain or pressure, SOB, DOE, PND, orthopnea, LE edema, dizziness, palpitations or syncope. She is compliant with her meds and is tolerating meds with no SE.    The patient does not have symptoms concerning for COVID-19 infection (fever, chills, cough, or new shortness of breath).    Prior CV studies:   The following studies were reviewed today:  2D echo, EKG  Past Medical History:  Diagnosis Date  . HOCM (hypertrophic obstructive cardiomyopathy) ([facility])    a. Echo (11/15):  Mod LVH, mod to severe focal basal hypertrophy of septum, EF 60-65%, Gr 1 DD, Ao sclerosis no stenosis, chordal SAM, mild MR, mild LAE, atrial septal aneurysm with sugg of small PFO (consider bubble study) >> b. ETT-Myoview 6/15 with normal BP response to exercise  . Hyperlipidemia   . Hypertension    Past Surgical History:  Procedure Laterality Date  . ABDOMINAL HYSTERECTOMY  1985   still has ovaries     Current Meds  Medication Sig  . amLODipine (NORVASC) 10 MG tablet Take 1 tablet (10 mg total) by mouth daily. Please make overdue appt with Dr. Mayford Knife before anymore refills. 2nd attempt  . aspirin EC 81 MG tablet Take 1 tablet (81 mg total) by mouth daily.  . Blood Glucose Monitoring  Suppl (ONE TOUCH ULTRA 2) W/DEVICE KIT 1 Device by Does not apply route as needed.  Marland Kitchen glucose blood (ONE TOUCH ULTRA TEST) test strip 1 each by Other route every morning. Use as instructed  . lisinopril-hydrochlorothiazide (ZESTORETIC) 20-25 MG tablet Take 1 tablet by mouth daily. Please make overdue appt with Dr. Mayford Knife before anymore refills. 3rd and Final Attempt  . metFORMIN (GLUCOPHAGE) 1000 MG tablet TAKE 1 TABLET(1000 MG) BY MOUTH TWICE DAILY WITH A MEAL  . metoprolol succinate (TOPROL-XL) 50 MG 24 hr tablet Take 1 tablet (50 mg total) by mouth daily. Please make overdue appt with Dr. Mayford Knife before anymore refills. 1st attempt  . Nutritional Supplements (ESTROVEN PO) Take by mouth.  . ONE TOUCH LANCETS MISC 1 each by Does not apply route every morning.  . pravastatin (PRAVACHOL) 40 MG tablet Take 1 tablet (40 mg total) by mouth daily.     Allergies:   Patient has no known allergies.   Social History   Tobacco Use  . Smoking status: Never Smoker  . Smokeless tobacco: Never Used  Substance Use Topics  . Alcohol use: Yes    Alcohol/week: 7.0 standard drinks    Types: 7 Glasses of wine per week  . Drug use: No     Family Hx: The patient's family history includes Alzheimer's disease in her mother, paternal aunt, and paternal uncle; Diabetes in her son. There is no history of Colon cancer.  ROS:   Please see the history of present illness.     All other systems reviewed and are negative.   Labs/Other Tests and Data Reviewed:    Recent Labs: No results found for requested labs within last 8760 hours.   Recent Lipid Panel Lab Results  Component Value Date/Time   CHOL 127 07/08/2015 09:30 AM   TRIG 65 07/08/2015 09:30 AM   HDL 41 (L) 07/08/2015 09:30 AM   CHOLHDL 3.1 07/08/2015 09:30 AM   LDLCALC 73 07/08/2015 09:30 AM    Wt Readings from Last 3 Encounters:  08/25/20 182 lb 3.2 oz (82.6 kg)  05/28/18 178 lb (80.7 kg)  09/08/17 188 lb (85.3 kg)     Objective:     Vital Signs:  BP (!) 160/92   Pulse 81   Ht 5\' 7"  (1.702 m)   Wt 182 lb 3.2 oz (82.6 kg)   BMI 28.54 kg/m    GEN: Well nourished, well developed in no acute distress HEENT: Normal NECK: No JVD; No carotid bruits LYMPHATICS: No lymphadenopathy CARDIAC:RRR, no murmurs, rubs, gallops RESPIRATORY:  Clear to auscultation without rales, wheezing or rhonchi  ABDOMEN: Soft, non-tender, non-distended MUSCULOSKELETAL:  No edema; No deformity  SKIN: Warm and dry NEUROLOGIC:  Alert and oriented x 3 PSYCHIATRIC:  Normal affect   EKG was done in the office today and showed ectopic atrial bradycardia at 81bpm with low voltage QRS in limb leads and PVC  ASSESSMENT & PLAN:    1.  HOCM  - she was noted to have significant LVH and chordal Sam on echo but no significant dynamic outflow gradient.   -there is no hx of syncope and no fm hx of SCD -She remains asymptomatic with no chest pain or shortness of breath.   -continue Toprol XL 50mg  daily -repeat event monitor to assess for ventricular arrhythmias  2.  Hypertension  -BP controlled -continue amlodipine 10mg  daily, Zestoretic 20-25mg  daily and Toprol Xl 50mg  daily -she refuses to have blood draws and she has not had blood work done in 3 years -she has significant paranoia that someone is going to steel her DNA or use her labwork to hurt her -I informed her that I could no longer refill her medication except for her Toprol because she refuses blood work and cannot check her BMET  3.  Nonsustained atrial tachycardia  -she has not had any palpitations -continue Toprol XL 50mg  daily   4.  Type 2 diabetes mellitus  -She is on metformin 1000mg  twice daily.   -This is followed by her PCP.   Medication Adjustments/Labs and Tests Ordered: Current medicines are reviewed at length with the patient today.  Concerns regarding medicines are outlined above.  Tests Ordered: No orders of the defined types were placed in this  encounter.  Medication Changes: No orders of the defined types were placed in this encounter.   Disposition:  Follow up in 1 year(s)  Signed, Fransico Him, MD  08/25/2020 3:48 PM    [place]

## 2020-08-25 NOTE — Patient Instructions (Signed)

## 2020-09-08 ENCOUNTER — Ambulatory Visit: Payer: Medicare Other | Attending: Internal Medicine

## 2020-09-08 DIAGNOSIS — Z23 Encounter for immunization: Secondary | ICD-10-CM

## 2020-09-08 NOTE — Progress Notes (Signed)
   Covid-19 Vaccination Clinic  Name:  CARALEE MOREA    MRN: [id number] DOB: 1941/08/18  09/08/2020  Ms. Milillo was observed post Covid-19 immunization for 15 minutes without incident. She was provided with Vaccine Information Sheet and instruction to access the V-Safe system.   Ms. Wardle was instructed to call 911 with any severe reactions post vaccine: Marland Kitchen Difficulty breathing  . Swelling of face and throat  . A fast heartbeat  . A bad rash all over body  . Dizziness and weakness

## 2020-09-15 ENCOUNTER — Other Ambulatory Visit: Payer: Self-pay

## 2020-09-15 DIAGNOSIS — E119 Type 2 diabetes mellitus without complications: Secondary | ICD-10-CM

## 2020-09-15 DIAGNOSIS — E785 Hyperlipidemia, unspecified: Secondary | ICD-10-CM

## 2020-09-16 ENCOUNTER — Other Ambulatory Visit: Payer: Self-pay | Admitting: *Deleted

## 2020-09-16 DIAGNOSIS — E785 Hyperlipidemia, unspecified: Secondary | ICD-10-CM

## 2020-09-16 DIAGNOSIS — E119 Type 2 diabetes mellitus without complications: Secondary | ICD-10-CM

## 2020-09-16 MED ORDER — PRAVASTATIN SODIUM 40 MG PO TABS: 40.0000 mg | ORAL_TABLET | Freq: Every day | ORAL | 3 refills | Status: DC

## 2020-09-16 MED ORDER — PRAVASTATIN SODIUM 40 MG PO TABS: 40.0000 mg | ORAL_TABLET | Freq: Every day | ORAL | 0 refills | Status: DC

## 2020-09-16 MED ORDER — METFORMIN HCL 1000 MG PO TABS: 1000.0000 mg | ORAL_TABLET | Freq: Two times a day (BID) | ORAL | 0 refills | Status: DC

## 2020-09-16 MED ORDER — METFORMIN HCL 1000 MG PO TABS: 1000.0000 mg | ORAL_TABLET | Freq: Two times a day (BID) | ORAL | 0 refills | Status: AC

## 2021-02-19 DIAGNOSIS — H25813 Combined forms of age-related cataract, bilateral: Secondary | ICD-10-CM | POA: Diagnosis not present

## 2021-02-19 DIAGNOSIS — Z7984 Long term (current) use of oral hypoglycemic drugs: Secondary | ICD-10-CM | POA: Diagnosis not present

## 2021-02-19 DIAGNOSIS — E119 Type 2 diabetes mellitus without complications: Secondary | ICD-10-CM | POA: Diagnosis not present

## 2021-02-19 LAB — HM DIABETES EYE EXAM

## 2021-02-25 ENCOUNTER — Encounter: Payer: Self-pay | Admitting: Family Medicine

## 2021-04-27 DIAGNOSIS — Z1231 Encounter for screening mammogram for malignant neoplasm of breast: Secondary | ICD-10-CM | POA: Diagnosis not present

## 2021-05-31 ENCOUNTER — Other Ambulatory Visit: Payer: Self-pay | Admitting: Family Medicine

## 2021-05-31 DIAGNOSIS — E119 Type 2 diabetes mellitus without complications: Secondary | ICD-10-CM

## 2021-08-25 ENCOUNTER — Ambulatory Visit: Payer: Medicare Other | Admitting: Cardiology

## 2021-11-05 DIAGNOSIS — E7800 Pure hypercholesterolemia, unspecified: Secondary | ICD-10-CM | POA: Diagnosis not present

## 2021-11-05 DIAGNOSIS — E1165 Type 2 diabetes mellitus with hyperglycemia: Secondary | ICD-10-CM | POA: Diagnosis not present

## 2021-11-05 DIAGNOSIS — Z79899 Other long term (current) drug therapy: Secondary | ICD-10-CM | POA: Diagnosis not present

## 2021-11-05 DIAGNOSIS — R5383 Other fatigue: Secondary | ICD-10-CM | POA: Diagnosis not present

## 2021-12-13 DIAGNOSIS — R9431 Abnormal electrocardiogram [ECG] [EKG]: Secondary | ICD-10-CM | POA: Diagnosis not present

## 2021-12-30 DIAGNOSIS — R9431 Abnormal electrocardiogram [ECG] [EKG]: Secondary | ICD-10-CM | POA: Diagnosis not present

## 2022-02-22 DIAGNOSIS — H25813 Combined forms of age-related cataract, bilateral: Secondary | ICD-10-CM | POA: Diagnosis not present

## 2022-02-22 DIAGNOSIS — H5203 Hypermetropia, bilateral: Secondary | ICD-10-CM | POA: Diagnosis not present

## 2022-02-22 DIAGNOSIS — E119 Type 2 diabetes mellitus without complications: Secondary | ICD-10-CM | POA: Diagnosis not present

## 2022-02-22 DIAGNOSIS — H52203 Unspecified astigmatism, bilateral: Secondary | ICD-10-CM | POA: Diagnosis not present

## 2022-03-14 DIAGNOSIS — E559 Vitamin D deficiency, unspecified: Secondary | ICD-10-CM | POA: Diagnosis not present

## 2022-03-14 DIAGNOSIS — E119 Type 2 diabetes mellitus without complications: Secondary | ICD-10-CM | POA: Diagnosis not present

## 2022-03-14 DIAGNOSIS — F29 Unspecified psychosis not due to a substance or known physiological condition: Secondary | ICD-10-CM | POA: Diagnosis not present

## 2022-05-15 ENCOUNTER — Other Ambulatory Visit: Payer: Self-pay | Admitting: Family Medicine

## 2022-05-15 DIAGNOSIS — E785 Hyperlipidemia, unspecified: Secondary | ICD-10-CM

## 2022-07-13 DIAGNOSIS — R5383 Other fatigue: Secondary | ICD-10-CM | POA: Diagnosis not present

## 2022-07-13 DIAGNOSIS — I10 Essential (primary) hypertension: Secondary | ICD-10-CM | POA: Diagnosis not present

## 2022-07-13 DIAGNOSIS — E119 Type 2 diabetes mellitus without complications: Secondary | ICD-10-CM | POA: Diagnosis not present

## 2022-07-13 DIAGNOSIS — E782 Mixed hyperlipidemia: Secondary | ICD-10-CM | POA: Diagnosis not present

## 2022-07-13 DIAGNOSIS — R350 Frequency of micturition: Secondary | ICD-10-CM | POA: Diagnosis not present
